# Patient Record
Sex: Male | Born: 1970 | Race: White | Hispanic: No | Marital: Married
Health system: Midwestern US, Community
[De-identification: ages and names within clinical notes are randomized; demographics above are authoritative.]

---

## 2015-07-02 ENCOUNTER — Emergency Department: Admit: 2015-07-03 | Payer: MEDICAID | Primary: Family

## 2015-07-02 DIAGNOSIS — R569 Unspecified convulsions: Principal | ICD-10-CM

## 2015-07-02 NOTE — ED Notes (Signed)
Pt reports "not remembering" seizure-like activity, but states, "I was outside et all of a sudden I got real hot. Then next thing I know, I was sitting on the ground." reports today is "the first day I haven't had any alcohol."

## 2015-07-02 NOTE — ED Notes (Signed)
Report given to nurse caring for bed 214

## 2015-07-02 NOTE — ED Notes (Signed)
Presents to ED bed 12 via EMS. EMS reports family present et reported "seizure like activity".

## 2015-07-02 NOTE — ED Notes (Signed)
UA obtained et sent to lab.

## 2015-07-02 NOTE — ED Notes (Signed)
Pt back to floor via CT staff

## 2015-07-02 NOTE — H&P (Signed)
History and Physical        Patient: Kristopher Day               Sex: male             MRN: [id number]     Date of Birth:  1971/05/24      Age:  44 y.o. Marijo File, NP               CC:    Chief Complaint   Patient presents with   ??? Seizure       HPI:     Kristopher Day is a 44 y.o. male who has HTN. He drinks beer 8-12 cans a day and vodka diluted with water almost everyday. His last ETOH intake was yesterday. Today, he reported drinking about 5-6 liters of water. This evening, he was talking to his fiancee when he was noted to have generalized tonic clonic seizures that lasted for 5 minutes with urinary incontinence and later had post ictal period lasting for several minutes. He denied prior headache or aura and denies seizures in the past. In the ER, he is awake and able to answer questions. His Na is 123. He was admitted    History reviewed.  pertinent past medical history. + HTN    History reviewed. No pertinent past surgical history.    History reviewed. No pertinent family history.    History     Social History   ??? Marital Status: MARRIED     Spouse Name: N/A   ??? Number of Children: N/A   ??? Years of Education: N/A     Social History Main Topics   ??? Smoking status: Current Every Day Smoker -- 1.00 packs/day for 24 years   ??? Smokeless tobacco: Not on file   ??? Alcohol Use: Yes      Comment: i used to drink beer, but i started on vodka   ??? Drug Use: Not on file   ??? Sexual Activity: Not on file     Other Topics Concern   ??? None     Social History Narrative   ??? None       None       No Known Allergies    Review of Systems  Constitutional: negative for fevers, chills and sweats  Eyes: negative for irritation and redness  Ears, Nose, Mouth, Throat, and Face: negative for hearing loss and tinnitus  Respiratory: negative for cough or wheezing  Cardiovascular: negative for chest pain, palpitations  Gastrointestinal: negative for nausea and vomiting  Genitourinary:negative for frequency, dysuria and nocturia   Integument/Breast: negative for rash and pruritus  Hematologic/Lymphatic: negative for easy bruising and bleeding  Musculoskeletal:negative for myalgias and arthralgias  Neurological: negative for headaches and dizziness  Behavioral/Psychiatric: negative for anxiety  Endocrine: negative for temperature intolerance  Allergic/Immunologic: negative for urticaria and hay fever      There is no immunization history on file for this patient.    Physical Exam:     BP 143/97 mmHg   Pulse 93   Temp(Src) 98.3 ??F (36.8 ??C)   Resp 22   Ht  (1.854 m)   Wt 180 lb   BMI 23.75 kg/m2   SpO2 96%  General appearance: Alert, cooperative, no distress.  Head: Normocephalic, without obvious abnormality, atraumatic.  Eyes: Conjunctivae/corneas clear. PERRL, EOM's intact.  Ears, nose, throat: Lips, mucosa, and tongue normal. Teeth and gums normal and normal findings: oropharynx pink & moist without lesions.  Respiratory: Clear to auscultation bilaterally.  Cardiovascular: Regular rate and rhythm, S1, S2 normal, no JVD, no murmur or pedal edema.  Gastrointestinal: Soft, non-tender, non-distended, bowel sounds present, no organomegaly.  Genitourinary: No scrotal mass or hernia.   Musculoskeletal: No edema, redness or tenderness in the calves or thighs.  Vascular: 2+ and symmetric.  No carotid bruits.   Skin: Skin color, texture, turgor normal. No rashes or lesions.  Neurologic: Alert and oriented X 3, normal strength and tone. Normal symmetric reflexes. Normal coordination and gait.   Hematologic/Lymphatic: No ecchymosis or petechiae. No lymphadenopathy.    Psychiatric: No confusion or hallucinations.            Lab/Data Reviewed:    CMP:   Lab Results   Component Value Date/Time    NA 123* 07/02/2015 10:00 PM    K 4.4 07/02/2015 10:00 PM    CL 87* 07/02/2015 10:00 PM    CO2 19* 07/02/2015 10:00 PM    AGAP 17* 07/02/2015 10:00 PM    GLU 133* 07/02/2015 10:00 PM    BUN 9 07/02/2015 10:00 PM    CREA 0.91 07/02/2015 10:00 PM     GFRAA >60 07/02/2015 10:00 PM    GFRNA >60 07/02/2015 10:00 PM    CA 9.1 07/02/2015 10:00 PM     CBC:   Lab Results   Component Value Date/Time    WBC 7.2 07/02/2015 10:00 PM    HGB 12.7* 07/02/2015 10:00 PM    HCT 35.8* 07/02/2015 10:00 PM    PLT 135 07/02/2015 10:00 PM     All Cardiac Markers in the last 24 hours: No results found for: CPK, CKMMB, CKMB, RCK3, CKMBT, CKNDX, CKND1, MYO, TROPT, TROIQ, TROI, TROPT, TNIPOC, BNP, BNPP  Recent Glucose Results:   Lab Results   Component Value Date/Time    GLU 133* 07/02/2015 10:00 PM     ABG: No results found for: PH, PHI, PCO2, PCO2I, PO2, PO2I, HCO3, HCO3I, FIO2, FIO2I    CT head: no acute abnormality    Assessment/Plan     Patient Active Hospital Problem List:   New onset seizure (HCC) (07/02/2015)    Assessment: this may be related to ETOH use, ? Related to hyponatremia, vs. Primary seizure disorder    Plan: admit to telemetry  EEG, MRI of head  Ativan iv prn, will not start anticonvulsants at this time  Consult neurology     Hyponatremia (07/02/2015)    Assessment: may be due to polydipsia or photomania    Plan: restrict oral fluid intake to 1 liter a day  Consult nephrology. I discussed case with Dr. Penni BombardKendall  Serial BMP  Stop ETOH use  Saline infusion at 75 cc/hour     Alcohol abuse (07/02/2015)    Assessment: active    Plan: on thiamine, folic acid  Serax  Ativan iv prn for agitation  Consult psychiatry     HTN (hypertension) (07/02/2015)    Assessment: active    Plan: on lisinopril     Tobacco abuse (07/02/2015)    Assessment: chronic    Plan: smoking cessation, nicotine patch    DVT prophylaxis: SCD    Code status: full code    Case discussed with ER physician and nephrology    High risk due to seizure, hyponatremia, ETOH abuse           Melchizedek Espinola A Quindarrius [place], MD  11:38 PM

## 2015-07-03 ENCOUNTER — Inpatient Hospital Stay
Admit: 2015-07-03 | Discharge: 2015-07-05 | Disposition: A | Discharge status: Home / Self Care | Payer: MEDICAID | Attending: Family Medicine | Admitting: Family Medicine

## 2015-07-03 ENCOUNTER — Inpatient Hospital Stay: Admit: 2015-07-03 | Payer: MEDICAID | Primary: Family

## 2015-07-03 LAB — METABOLIC PANEL, COMPREHENSIVE
A-G Ratio: 1.3 (ref 1.2–2.2)
ALT (SGPT): 153 U/L — ABNORMAL HIGH (ref 12–78)
AST (SGOT): 123 U/L — ABNORMAL HIGH (ref 15–37)
Albumin: 4 g/dL (ref 3.4–5.0)
Alk. phosphatase: 55 U/L (ref 45–117)
Anion gap: 10 mmol/L (ref 6–15)
BUN/Creatinine ratio: 10 (ref 7–25)
BUN: 8 MG/DL (ref 7–18)
Bilirubin, total: 0.8 MG/DL (ref <1.1)
CO2: 26 mmol/L (ref 21–32)
Calcium: 9.2 MG/DL (ref 8.5–10.1)
Chloride: 98 mmol/L (ref 98–107)
Creatinine: 0.78 MG/DL (ref 0.60–1.30)
GFR est AA: >60 mL/min/{1.73_m2} (ref >60)
GFR est non-AA: >60 mL/min/{1.73_m2} (ref >60)
Globulin: 3.2 g/dL (ref 2.4–3.5)
Glucose: 86 mg/dL (ref 70–110)
Potassium: 4.2 mmol/L (ref 3.5–5.3)
Protein, total: 7.2 g/dL (ref 6.4–8.2)
Sodium: 134 mmol/L — ABNORMAL LOW (ref 136–145)

## 2015-07-03 LAB — URINALYSIS W/ RFLX MICROSCOPIC
Bilirubin: NEGATIVE
Glucose: NEGATIVE mg/dL
Ketone: 40 mg/dL — AB
Leukocyte Esterase: NEGATIVE
Nitrites: NEGATIVE
Specific gravity: 1.01 (ref 1.002–1.030)
Urobilinogen: 0.2 EU/dL (ref 0–1)
pH (UA): 5 (ref 4.5–8.0)

## 2015-07-03 LAB — ACETAMINOPHEN: Acetaminophen level: <2 ug/mL — ABNORMAL LOW (ref 10–30)

## 2015-07-03 LAB — CBC WITH AUTOMATED DIFF
ABS. BASOPHILS: 0 10*3/uL (ref 0.0–0.1)
ABS. BASOPHILS: 0 10*3/uL (ref 0.0–0.1)
ABS. EOSINOPHILS: 0.1 10*3/uL (ref 0.0–0.5)
ABS. EOSINOPHILS: 0.1 10*3/uL (ref 0.0–0.5)
ABS. LYMPHOCYTES: 1.4 10*3/uL (ref 0.8–3.5)
ABS. LYMPHOCYTES: 2.3 10*3/uL (ref 0.8–3.5)
ABS. MONOCYTES: 0.5 10*3/uL — ABNORMAL LOW (ref 0.8–3.5)
ABS. MONOCYTES: 0.6 10*3/uL — ABNORMAL LOW (ref 0.8–3.5)
ABS. NEUTROPHILS: 5.2 10*3/uL (ref 1.5–8.0)
ABS. NEUTROPHILS: 4.2 10*3/uL (ref 1.5–8.0)
BASOPHILS: 0 % (ref 0–2)
BASOPHILS: 0 % (ref 0–2)
EOSINOPHILS: 2 % (ref 0–5)
EOSINOPHILS: 2 % (ref 0–5)
HCT: 35.8 % — ABNORMAL LOW (ref 41–53)
HCT: 38.7 % — ABNORMAL LOW (ref 41–53)
HGB: 12.7 g/dL — ABNORMAL LOW (ref 13.5–17.5)
HGB: 13.6 g/dL (ref 13.5–17.5)
LYMPHOCYTES: 19 % (ref 19–48)
LYMPHOCYTES: 32 % (ref 19–48)
MCH: 34.3 PG — ABNORMAL HIGH (ref 27–31)
MCH: 33.9 PG — ABNORMAL HIGH (ref 27–31)
MCHC: 35.5 g/dL (ref 31–37)
MCHC: 35.1 g/dL (ref 31–37)
MCV: 96.8 FL (ref 78–98)
MCV: 96.5 FL (ref 78–98)
MONOCYTES: 6 % (ref 3–9)
MONOCYTES: 9 % (ref 3–9)
MPV: 9 FL (ref 5.9–10.3)
MPV: 9.7 FL (ref 5.9–10.3)
NEUTROPHILS: 73 % (ref 40–74)
NEUTROPHILS: 57 % (ref 40–74)
PLATELET: 135 10*3/uL (ref 130–400)
PLATELET: 151 10*3/uL (ref 130–400)
RBC: 3.7 M/uL — ABNORMAL LOW (ref 4.7–6.1)
RBC: 4.01 M/uL — ABNORMAL LOW (ref 4.7–6.1)
RDW: 12.1 % (ref 11.5–14.5)
RDW: 12.2 % (ref 11.5–14.5)
WBC: 7.2 10*3/uL (ref 4.5–10.8)
WBC: 7.2 10*3/uL (ref 4.5–10.8)

## 2015-07-03 LAB — DRUG SCREEN, URINE
AMPHETAMINES: NEGATIVE
BARBITURATES: NEGATIVE
BENZODIAZEPINES: NEGATIVE
COCAINE: NEGATIVE
METHADONE: NEGATIVE
OPIATES: NEGATIVE
PCP(PHENCYCLIDINE): NEGATIVE
THC (TH-CANNABINOL): NEGATIVE
TRICYCLICS: NEGATIVE

## 2015-07-03 LAB — METABOLIC PANEL, BASIC
Anion gap: 17 mmol/L — ABNORMAL HIGH (ref 6–15)
Anion gap: 6 mmol/L (ref 6–15)
Anion gap: 6 mmol/L (ref 6–15)
Anion gap: 9 mmol/L (ref 6–15)
BUN/Creatinine ratio: 10 (ref 7–25)
BUN/Creatinine ratio: 8 (ref 7–25)
BUN/Creatinine ratio: 8 (ref 7–25)
BUN/Creatinine ratio: 11 (ref 7–25)
BUN: 9 MG/DL (ref 7–18)
BUN: 7 MG/DL (ref 7–18)
BUN: 8 MG/DL (ref 7–18)
BUN: 9 MG/DL (ref 7–18)
CO2: 19 mmol/L — ABNORMAL LOW (ref 21–32)
CO2: 28 mmol/L (ref 21–32)
CO2: 30 mmol/L (ref 21–32)
CO2: 24 mmol/L (ref 21–32)
Calcium: 9.1 MG/DL (ref 8.5–10.1)
Calcium: 9.1 MG/DL (ref 8.5–10.1)
Calcium: 9.6 MG/DL (ref 8.5–10.1)
Calcium: 9.3 MG/DL (ref 8.5–10.1)
Chloride: 87 mmol/L — ABNORMAL LOW (ref 98–107)
Chloride: 99 mmol/L (ref 98–107)
Chloride: 96 mmol/L — ABNORMAL LOW (ref 98–107)
Chloride: 99 mmol/L (ref 98–107)
Creatinine: 0.91 MG/DL (ref 0.60–1.30)
Creatinine: 0.89 MG/DL (ref 0.60–1.30)
Creatinine: 1.02 MG/DL (ref 0.60–1.30)
Creatinine: 0.82 MG/DL (ref 0.60–1.30)
GFR est AA: >60 mL/min/{1.73_m2} (ref >60)
GFR est AA: >60 mL/min/{1.73_m2} (ref >60)
GFR est AA: >60 mL/min/{1.73_m2} (ref >60)
GFR est AA: >60 mL/min/{1.73_m2} (ref >60)
GFR est non-AA: >60 mL/min/{1.73_m2} (ref >60)
GFR est non-AA: >60 mL/min/{1.73_m2} (ref >60)
GFR est non-AA: >60 mL/min/{1.73_m2} (ref >60)
GFR est non-AA: >60 mL/min/{1.73_m2} (ref >60)
Glucose: 133 mg/dL — ABNORMAL HIGH (ref 70–110)
Glucose: 108 mg/dL (ref 70–110)
Glucose: 96 mg/dL (ref 70–110)
Glucose: 127 mg/dL — ABNORMAL HIGH (ref 70–110)
Potassium: 4.4 mmol/L (ref 3.5–5.3)
Potassium: 4 mmol/L (ref 3.5–5.3)
Potassium: 4 mmol/L (ref 3.5–5.3)
Potassium: 4 mmol/L (ref 3.5–5.3)
Sodium: 123 mmol/L — ABNORMAL LOW (ref 136–145)
Sodium: 133 mmol/L — ABNORMAL LOW (ref 136–145)
Sodium: 132 mmol/L — ABNORMAL LOW (ref 136–145)
Sodium: 132 mmol/L — ABNORMAL LOW (ref 136–145)

## 2015-07-03 LAB — CBC W/O DIFF
HCT: 38 % — ABNORMAL LOW (ref 41–53)
HGB: 13.3 g/dL — ABNORMAL LOW (ref 13.5–17.5)
MCH: 33.6 PG — ABNORMAL HIGH (ref 27–31)
MCHC: 35 g/dL (ref 31–37)
MCV: 96 FL (ref 78–98)
MPV: 9.3 FL (ref 5.9–10.3)
PLATELET: 139 10*3/uL (ref 130–400)
RBC: 3.96 M/uL — ABNORMAL LOW (ref 4.7–6.1)
RDW: 12.1 % (ref 11.5–14.5)
WBC: 4.8 10*3/uL (ref 4.5–10.8)

## 2015-07-03 LAB — CORTISOL: Cortisol, random: 12.9 ug/dL

## 2015-07-03 LAB — SODIUM, UR, RANDOM: Sodium,urine random: 22 MMOL/L (ref 20–110)

## 2015-07-03 LAB — URINE MICROSCOPIC

## 2015-07-03 LAB — TSH 3RD GENERATION: TSH: 2.56 u[IU]/mL (ref 0.35–3.74)

## 2015-07-03 LAB — SALICYLATE: Salicylate level: <2.8 MG/DL — ABNORMAL LOW (ref 2.8–20.0)

## 2015-07-03 LAB — ETHYL ALCOHOL: ALCOHOL(ETHYL),SERUM: NOT DETECTED MG/DL

## 2015-07-03 LAB — MAGNESIUM: Magnesium: 2.1 mg/dL (ref 1.8–2.4)

## 2015-07-03 LAB — PHOSPHORUS: Phosphorus: 3.7 MG/DL (ref 2.5–4.9)

## 2015-07-03 LAB — T4 (THYROXINE): T4, Total: 7.2 ug/dL (ref 4.7–13.3)

## 2015-07-03 MED ORDER — PANTOPRAZOLE 40 MG TAB, DELAYED RELEASE
40 mg | Freq: Every day | ORAL | Status: DC
Start: 2015-07-03 — End: 2015-07-05
  Administered 2015-07-03 – 2015-07-05 (×3): via ORAL

## 2015-07-03 MED ORDER — DEXTROSE 5% IN WATER (D5W) IV
Freq: Once | INTRAVENOUS | Status: AC
Start: 2015-07-03 — End: 2015-07-03
  Administered 2015-07-03: 11:00:00 via INTRAVENOUS

## 2015-07-03 MED ORDER — SODIUM CHLORIDE 0.9 % IV
INTRAVENOUS | Status: DC
Start: 2015-07-03 — End: 2015-07-03
  Administered 2015-07-03: 05:00:00 via INTRAVENOUS

## 2015-07-03 MED ORDER — LORAZEPAM 2 MG/ML IJ SOLN
2 mg/mL | INTRAMUSCULAR | Status: DC | PRN
Start: 2015-07-03 — End: 2015-07-05
  Administered 2015-07-03 – 2015-07-05 (×3): via INTRAVENOUS

## 2015-07-03 MED ORDER — ACETAMINOPHEN 325 MG TABLET
325 mg | Freq: Four times a day (QID) | ORAL | Status: DC | PRN
Start: 2015-07-03 — End: 2015-07-05

## 2015-07-03 MED ORDER — DEXTROSE 5% IN WATER (D5W) IV
INTRAVENOUS | Status: DC
Start: 2015-07-03 — End: 2015-07-04
  Administered 2015-07-03 – 2015-07-04 (×4): via INTRAVENOUS

## 2015-07-03 MED ORDER — OXAZEPAM 10 MG CAP
10 mg | Freq: Three times a day (TID) | ORAL | Status: DC
Start: 2015-07-03 — End: 2015-07-05
  Administered 2015-07-03 – 2015-07-05 (×8): via ORAL

## 2015-07-03 MED ORDER — FOLIC ACID 1 MG TAB
1 mg | Freq: Every day | ORAL | Status: DC
Start: 2015-07-03 — End: 2015-07-05
  Administered 2015-07-03 – 2015-07-05 (×4): via ORAL

## 2015-07-03 MED ORDER — THIAMINE HCL 100 MG TAB
100 mg | Freq: Every day | ORAL | Status: DC
Start: 2015-07-03 — End: 2015-07-05
  Administered 2015-07-03 – 2015-07-05 (×4): via ORAL

## 2015-07-03 MED ORDER — LEVETIRACETAM 250 MG TAB
250 mg | Freq: Two times a day (BID) | ORAL | Status: DC
Start: 2015-07-03 — End: 2015-07-05
  Administered 2015-07-03 – 2015-07-05 (×5): via ORAL

## 2015-07-03 MED ORDER — SODIUM CHLORIDE 0.9 % IJ SYRG
Freq: Three times a day (TID) | INTRAMUSCULAR | Status: DC
Start: 2015-07-03 — End: 2015-07-05
  Administered 2015-07-03 – 2015-07-05 (×8): via INTRAVENOUS

## 2015-07-03 MED ORDER — THIAMINE 100 MG/ML INJECTION
100 mg/mL | Freq: Once | INTRAMUSCULAR | Status: AC
Start: 2015-07-03 — End: 2015-07-02
  Administered 2015-07-03: 02:00:00 via INTRAVENOUS

## 2015-07-03 MED ORDER — NICOTINE 21 MG/24 HR DAILY PATCH
21 mg/24 hr | TRANSDERMAL | Status: DC
Start: 2015-07-03 — End: 2015-07-05
  Administered 2015-07-05: 05:00:00 via TRANSDERMAL

## 2015-07-03 MED ORDER — LISINOPRIL 10 MG TAB
10 mg | Freq: Two times a day (BID) | ORAL | Status: DC
Start: 2015-07-03 — End: 2015-07-05
  Administered 2015-07-03 – 2015-07-05 (×7): via ORAL

## 2015-07-03 MED ORDER — SODIUM CHLORIDE 0.9 % IJ SYRG
INTRAMUSCULAR | Status: DC | PRN
Start: 2015-07-03 — End: 2015-07-05

## 2015-07-03 MED FILL — LEVETIRACETAM 250 MG TAB: 250 mg | ORAL | Qty: 1

## 2015-07-03 MED FILL — OXAZEPAM 10 MG CAP: 10 mg | ORAL | Qty: 1

## 2015-07-03 MED FILL — LISINOPRIL 10 MG TAB: 10 mg | ORAL | Qty: 2

## 2015-07-03 MED FILL — DEXTROSE 5% IN WATER (D5W) IV: INTRAVENOUS | Qty: 1000

## 2015-07-03 MED FILL — NICOTINE 21 MG/24 HR DAILY PATCH: 21 mg/24 hr | TRANSDERMAL | Qty: 1

## 2015-07-03 MED FILL — PANTOPRAZOLE 40 MG TAB, DELAYED RELEASE: 40 mg | ORAL | Qty: 1

## 2015-07-03 MED FILL — NORMAL SALINE FLUSH 0.9 % INJECTION SYRINGE: INTRAMUSCULAR | Qty: 10

## 2015-07-03 MED FILL — SODIUM CHLORIDE 0.9 % IV: INTRAVENOUS | Qty: 1000

## 2015-07-03 MED FILL — FOLIC ACID 1 MG TAB: 1 mg | ORAL | Qty: 1

## 2015-07-03 MED FILL — VITAMIN B-1 100 MG TABLET: 100 mg | ORAL | Qty: 1

## 2015-07-03 MED FILL — LORAZEPAM 2 MG/ML IJ SOLN: 2 mg/mL | INTRAMUSCULAR | Qty: 1

## 2015-07-03 NOTE — Progress Notes (Signed)
Problem: Seizure Disorder (Adult)  Goal: Interventions  Outcome: Progressing Towards Goal  No seizure activity since admission    Problem: Diarrhea (Adult and Pediatrics)  Goal: *Absence of diarrhea  Outcome: Progressing Towards Goal  Pt reports having diarrhea prior to admission

## 2015-07-03 NOTE — Progress Notes (Signed)
Chart screened for discharge planning needs per Case Management high risk protocol.  No intervention is required at this time.   Though no CM staff have been assigned to this case, CSW/CM are available to assist PRN should needs arise.

## 2015-07-03 NOTE — Progress Notes (Signed)
Bedside and Verbal shift change report given to Angie,rN (oncoming nurse) by Jennifer,rN (offgoing nurse). Report included the following information SBAR, Kardex, Intake/Output, MAR and Recent Results.

## 2015-07-03 NOTE — Consults (Signed)
[place] [facility]  CONSULTATION REPORT    Name:  Kristopher Day, Kristopher Day  MR #:  [id number]    Account #:  [id number]    DOB:  1971/08/11    Age:  44Y  Location:  214  Admitted:  07/02/2015    DATE OF CONSULTATION:    07/03/2015  REFERRING PHYSICIAN:          Hermenia Bers, MICHAEL R       REASON FOR CONSULTATION:  Nephrology consultation is being obtained regarding evaluation and management of hyponatremia in this 44 year old Caucasian male who was admitted last night with chief complaint of new onset seizure. The patient admits to a long history of heavy alcohol ingestion and began reducing alcohol consumption over the past several weeks after being told that liver function tests were abnormal.  He did not ingest any alcohol on the day of admission and instead drank a large quantity of water.  He was brought to the Emergency Room after having a seizure and admitted for evaluation and therapy.  Laboratory testing at the time of presentation indicated the sodium was 123 mEq/L, chloride was depressed to 87 mEq/L, carbon dioxide content was 19 mEq/L, anion gap was increased to 17 mmol/L, glucose was 133 mg/dL, calcium was normal, potassium was normal, and BUN and creatinine were normal. Sodium was depressed to 134 mEq/L on 05/11/2015. The patient denied prior knowledge of hyponatremia.     MEDICATIONS PRIOR TO ADMISSION:  1. Oral and topical Diclofenac.   2. Lisinopril.     He was not taking diuretics or other pharmaceutical agents at the time of admission.     Thyroid function testing was normal today. Urinalysis indicated the presence of trace protein, small blood and 40 mg/dL ketone by dipstick. Microscopic examination of the urinary sediment was unremarkable. Urine sodium was 22 mmol/L.      PAST MEDICAL HISTORY:  1. Hypertension.  2. Alcoholism.   3. Elevated liver function testing. The appearance of the liver was normal on right upper quadrant ultrasound performed on 06/06/2015.      PAST SURGICAL HISTORY:   He has undergone no surgery.    FAMILY HISTORY:   Negative for hepatic problems.     SOCIAL HISTORY:   The patient was not employed at the time of admission.  He smoked  cigarettes and used alcohol as noted above.      REVIEW OF SYSTEMS:   GENERAL:  Significant for weakness.    NEUROLOGIC: Significant for headache and new onset seizures.    OPHTHALMOLOGIC: Negative for changes in visual acuity.   ENDOCRINOLOGIC: Negative for knowledge of symptoms of diabetes mellitus or thyroid disease.    PULMONARY: Negative for shortness of breath, productive cough or hemoptysis. Chest radiograph at the time of admission was normal.    CARDIAC: Negative for chest pain, palpitations or knowledge of heart disease.  GI: Negative for nausea, vomiting, abdominal pain or bowel disturbance.    GU:  Negative for knowledge or symptoms of nephrolithiasis or complaints referable to the lower urinary tract.  MUSCULOSKELETAL: Positive for pain in the left knee and great toes in the setting of gouty arthritis. Patient was using oral and topical non-steroidal anti-inflammatory drug for symptomatic relief.  DERMATOLOGIC: Negative for skin rash or skin breakdown.  HEMATOLOGIC: Hemogram was normal.  Negative for visible blood loss.  IMMUNOLOGIC:  Negative for symptoms of connective tissue or vasculitis.    PSYCHIATRIC: Positive for alcohol abuse.  Negative for  symptoms of delirium or effective disorder.      PHYSICAL EXAMINATION:   The patient was a well-developed, well-nourished, middle-aged male in no acute distress.  Earlier today the temperature was 98.2, heart rate 80, respiratory rate 16, blood pressure 112/73, oxygen saturation 100%.   HEENT:   Sclerae clear.  Mucous membranes moist. Oropharynx clear.   NECK:  No lymphadenopathy, JVD, thyromegaly, or bruits.    LUNGS:  Clear to auscultation.  Respirations unlabored.    HEART:  Rhythm regular without pericardial friction rub or significant murmur.     ABDOMEN:  Soft without masses, organomegaly, tenderness, or bruits.   EXTREMITIES:  No edema.  Digits well perfused.   SKIN:  No rashes present.    IMPRESSION:   Middle-aged male with hyponatremia possibly due to inadequate solute ingestion in the setting of longstanding heavy alcohol use and excessive oral fluid intake. Duration of hyponatremia is uncertain but sodium may have precipitously decreased following free water ingestion immediately prior to onset of seizure. Hyponatremia has improved following  administration of isotonic saline. Sodium has increased to 132 mEq/L.  Hydration is currently being administered using 5% Dextrose in water to prevent inappropriately rapid correction of hyponatremia. The patient is asymptomatic from the standpoint of hyponatremia. Administration of hypotonic intravenous fluid will be continued at 75 cc/hour. Chemistries will be carefully followed. Electrolyte imbalances will be treated as indicated.  It is likely that if the patient abstains from alcohol and follows a normal diet, hyponatremia will resolve and not reoccur.  He was advised of the  importance of abstaining from alcohol. Keppra has been started to prevent further seizures.       __________________________________   Carmin RichmondJames Antionio Negron, M.D.    JE:sp  DD: 07/03/2015 21:01:28  DT: 07/04/2015 07:01:36  Job ID:  [id number]  CC:

## 2015-07-03 NOTE — Progress Notes (Signed)
Lab called delta failure for sodium 134 today.  Primary RN notified.

## 2015-07-03 NOTE — Consults (Signed)
[id number]

## 2015-07-03 NOTE — Consults (Signed)
[place] [facility]  CONS[facility]ATION [facility]    Name:  Kristopher DissHARRIS, Blayton  MR #:  [id number]    Account #:  [id number]    DOB:  Oct 31, 1971    Age:  44Y  Location:  214  Admitted:  07/02/2015    DATE OF CONSULTATION:    07/03/2015  REFERRING PHYSICIAN:          SMUTKO, MICHAEL R       SOURCE OF INFORMATION:  The patient's fiance, Miss Shanda BumpsJessica and review of the medical record.  The patient agreed to be evaluated in front of fiance.     REASON FOR PSYCHIATRIC CONSULTATION:   Psychiatric evaluation of a patient who was admitted following a  seizure.      DEMOGRAPHICS:  This is a 44 year old Caucasian male, single, engage, currently  unemployed, recently got laid off due to corporate cut back.  He  currently lives with his fiance with two children who are currently  in church.   He denies any prior history of psychiatric treatment.     CHIEF COMPLAINT:   EMG took the patient to the hospital due to seizure.     HISTORY OF PRESENT ILLNESS:   The patient reports that he doesn't know exactly know what happened  prior to his coming to the hospital.  He tells me that he was told  he had a seizure.  The patient's fiance was there and the patient  was evaluated in front of with patient's consent.  She had stated  that the patient was outside and they had a brief chat with patient  and then the next thing she noted was he fell over, she was very  terrified and she called the EMS.  She noted that he had started  having tonic clonic convulsions; the patient was jerking on the  floor and not breathing. By the time he came around he was confused  and she apparently reported that he had turned blue.  When the EMS  came he got into the EMS and the EMS took the patient to the  hospital.  The patient was transferred from the Emergency Room to  the medical service.  The patient stated that he had been drinking  beer, about 15 beers per day; sometimes he takes vodka up to about a   5th of vodka per day.  He reported that he had been trying to get  off beer by taking vodka, he went down to about 8 to 9 beers and he  decided to stop cold [place]. Following stopping alcohol he reports  that he had the seizure episode.  He reports that he has been  drinking for about 20 years now.  He has never had a blackout from  drinking and he denies any prior history of seizure from drinking  until this current episode.  He also denies a prior history of  seizure.  He reports that this is the first time he has had a  seizure in his life.  He said that he has really never had a history  of withdrawal symptoms because he is always drinking alcohol.  The  patient reports he is currently having cravings, he has reduced  sleep, and he is having shakes.  He stated that drinking helps with  his sleep. The patient denies any auditory, visual or tactile  hallucinations.  He denied delusions.  He denies any paranoid of  persecutory ideation.  He denies depression.  He denies anhedonia.  He reports even though he has anxiety he rarely has panic attacks.  The patient reports that he has never really been sober.  He has had  several consequences from alcohol use, namely failing health and  finances.  Also he stated his father has an alcohol problem.  He  stated that nothing good has really come out of his alcohol use.  He  denies any mood swings, irritability or any manic symptoms.  The  patient denies any homicidal thoughts.  The patient denies any  concurrent use of any other substances.     PAST PSYCHIATRIC HISTORY:   The patient reports that he has never been through detox.  He has a  history of two DUIs in the past requiring him to go to court  mandated rehab; first time was for 72 hours and another time he  reports he was sober for several days, cannot remember how many days  he stayed in rehab.  He stated that when he started alcohol it only  took a couple to get drunk, however, it got to the point where he   started having to drink 15 to get drunk.  He denies any history of  suicide attempt. He denies any history of violence. He doesn't have  a psychiatrist.  He has never been through AA or NA.      PAST MEDICAL HISTORY:   The patient reports a prior history of hypertension, sinusitis,  however, denies any history of DTs of blackouts. He has never had a  seizure until current episode related to his current admission.     PAST SURGICAL HISTORY:   The patient denies.     ALLERGIES:   SEASONAL ALLERGIES.    FAMILY HISTORY:   Noncontributory.    PERSONAL AND SOCIAL HISTORY:   The patient was born in [place], [place], raised by his parents,  reports upbringing as good.  He reports that he is educated and had  actually gone into the [employer], having about four years the  [employer].  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  He received an honorable discharge not service connected and he is  working on getting service connected.      LEGAL HISTORY:   The patient reports a history of two DUIs.      REVIEW OF SYSTEMS:   Performed by the medical service.    PHYSICAL EXAMINATION:   Unchanged from that performed by the medical service.     MENTAL STATUS EXAMINATION:   This is a young man looking his stated age, dressed appropriate to  weather with fair grooming and hygiene.  He is calm, cooperative and  appropriate in behavior.  Speech is spontaneous, clear and coherent.  Mood is stable. Thoughts are logical and goal directed.  He denied  suicidal thoughts.  He denies homicidal thoughts.   He denies  hallucination.  He denied delusions.  He denied any paranoid or  persecutory ideation.  He is alert, awake, and oriented to time,  place, and person.   He has good attention span.  He has fairly good  insight and judgement.      DIAGNOSTIC LABS:  WBCs 4.8, platelets 139, sodium 133, potassium 4.0.  BUN 7,  creatinine 0.89.   MRI brain shows normal noncontrasted MRI.  The   patient had an EEG performed in view of his seizures and alcohol  use. There were no epileptiform discharges or electrographic  seizures noted,  current report by Dr. Harlene Salts.       DSM DIAGNOSIS:  AXIS I:   Alcohol use disorder-severe.                 History of unspecified anxiety disorder.   AXIS II:  Deferred.  AXIS III: History of hypertension, seizure, and seasonal allergies.    AXIS IV:  Poor coping skills, chemical dependency, recent job loss.  AXIS V:   Global Assessment of Functioning of 30-35.     ASSESSMENT:   This is a 44 year old Caucasian male who has a long history of  alcohol use disorder who was admitted because he had had a seizure  after trying to come off of alcohol cold [place]. Psychiatry was  called to evaluate the patient.  When I spoke with the patient the  patient denies any concurrent use of other substances. He reported  that the seizure was following his trying to come off of alcohol  cold [place] after having been drinking every day for about 20 years.  He has been in the [employer] in the past and currently he has just  recently lost his job. His fiance who he is engaged to is very  supportive of patient.  He reports his fiance does not drink.  The  patient denies suicidal thoughts, denies homicidal thoughts, denies  depression, and denies anhedonia. He verbalized feeling hopeful and  reports intact self-esteem.  The patient denies any psychosis.  He  denies any prior history of DTs.  The patient would benefit from  inpatient behavioral health detox; however, when this was discussed  with the patient.  The patient appears to be undecided on whether or  not he wants to come for inpatient behavioral health detox or not.  He stated that he would like to consult with his fiance and decide  whether or not he intends to come. The patient was, however,  informed that it is safer for him medically to come to inpatient  detox and he was educated on the dangers of withdrawal from alcohol   which include repeated seizure and accidental death and he  verbalized adequate understanding.  Other consequences of alcohol  were also discussed with patient. The patient stated that he was  going to think about it.     PSYCHIATRIC RECOMMENDATIONS:   The patient needs inpatient behavioral health detox (this has to be  voluntary).  If the patient is unwilling to come for inpatient  behavioral health detox then psychiatric intake to call for further  follow-up of substance abuse appointment with patient and to give  patient contacts of preventative community substance abuse treatment  options including residential rehab and outpatient substance abuse  treatment options. Psychiatry will continue to follow the patient.  Continue current detox protocol. Call psychiatric MD on call as  needed.        _____________________________   Derry Lory, M.D.    AO:sp  DD: 07/03/2015 21:08:00  DT: 07/04/2015 07:22:05  Job ID:  [id number]  CC:

## 2015-07-03 NOTE — Progress Notes (Signed)
Pt settled into assigned bed, AAOx4, denies pain or difficulty breathing on room air, resp are even and unlabored, call light in reach, plan of care reviewed with pt, telemetry in use monitored via remote monitoring, will cont to monitor.

## 2015-07-03 NOTE — Consults (Signed)
[id number]

## 2015-07-03 NOTE — Progress Notes (Signed)
Primary Nurse Marlowe AltNATALIE R YATES, RN and Tresa EndoKelly, RN performed a dual skin assessment on this patient No impairment noted  Braden score is 23

## 2015-07-03 NOTE — Procedures (Signed)
[place] [facility]  ELECTROENCEPHALOGRAM    Name:  Kristopher Day, Kristopher Day  MR #:  [id number]  Account #:  [id number]  DOB:  07/23/1971  Age:  44Y  Ref MD:  SMUTKO, MICHAEL R      Location: 214  Date of Service:  07/02/2015    ELECTROENCEPHALOGRAM REPORT    CLINICAL HISTORY:   This is a 44 year old male who had a seizure and was found to have  hyponatremia and alcohol abuse.      EEG DESCRIPTION:  When maximally awake, posterior dominant rhythm consisted of 9-10  hertz Alpha rhythm. Photic stimulation produced a driving response.  Drowsiness is characterized by diffuse Theta and Delta slowing.        IMPRESSION:  This is a normal awake and drowsy electroencephalogram.    CONCLUSION:  There were no epileptiform discharges or electrographic seizures  noted.       ___________________________________  Kaidan Spengler, M.D., Clinical Neurophysiology    SK:sp  DD:07/03/2015 11:41:54   DT: 07/03/2015 11:50:25   Job ID:  [id number]  CC:

## 2015-07-03 NOTE — Progress Notes (Signed)
Problem: Falls - Risk of  Goal: *Absence of falls  Outcome: Progressing Towards Goal  No falls noted throughout the shift  Goal: *Knowledge of fall prevention  Outcome: Progressing Towards Goal  Fall risk assessment completed upon admission    Problem: Alcohol Withdrawal  Goal: Interventions  Outcome: Progressing Towards Goal  See medications available

## 2015-07-03 NOTE — Consults (Signed)
Chauncy Lean, MD   [address], [address] [place], [place] [address]  Phone:  [phone]  Fax:  [phone]      Patient ID  Name:  Kristopher Day  DOB:  1971-08-08  MRN:  [id number]  Age:  44 y.o.  PCP:  Marijo File, NP    Subjective:     Encounter Date:  07/02/2015    Referring Physician: No ref. provider found    Chief Complaint   Patient presents with   ??? Seizure       History of Present Illness:   44 year old male with no seizure risk factors, has being drinking excess alcohol since few years. Was trying to cut down on it. Seizure yesterday for 3 minutes, generalized shaking. UI, followed by confusion. No previous episodes like this. On admission sodium is 123.   since admission no episodes  No Known Allergies  Patient Active Problem List   Diagnosis Code   ??? New onset seizure (HCC) R56.9   ??? Hyponatremia E87.1   ??? Alcohol abuse F10.10   ??? HTN (hypertension) I10   ??? Tobacco abuse Z72.0   ??? Transaminitis R74.0     History reviewed. No pertinent past medical history.   History reviewed. No pertinent past surgical history.   History reviewed. No pertinent family history.   History     Social History   ??? Marital Status: MARRIED     Spouse Name: N/A   ??? Number of Children: N/A   ??? Years of Education: N/A     Social History Main Topics   ??? Smoking status: Current Every Day Smoker -- 1.00 packs/day for 24 years   ??? Smokeless tobacco: Not on file   ??? Alcohol Use: Yes      Comment: i used to drink beer, but i started on vodka   ??? Drug Use: Not on file   ??? Sexual Activity: Not on file     Other Topics Concern   ??? None     Social History Narrative   ??? None       Review of Systems:  ROS      Objective:     Filed Vitals:    07/03/15 0729   BP: 115/76   Pulse: 70   Temp: 97.4 ??F (36.3 ??C)   Resp: 18   Height:    Weight:    SpO2: 100%       Physical Exam:  Neurologic Exam     Mental Status   Oriented to person, place, and time.     Cranial Nerves   Cranial nerves II through XII intact.     Motor Exam    Muscle bulk: normal  Overall muscle tone: normal    Strength   Strength 5/5 throughout.     Sensory Exam   Light touch normal.     Gait, Coordination, and Reflexes     Gait  Gait: normal    Reflexes   Right brachioradialis: 2+  Left brachioradialis: 2+  Right biceps: 2+  Left biceps: 2+  Right triceps: 2+  Left triceps: 2+  Right patellar: 2+  Left patellar: 2+  Right achilles: 2+  Left achilles: 2+  Left grip: 2+  Right plantar: normal  Left plantar: normal  Right Hoffman: absent  Left Hoffman: absent  Right ankle clonus: absent  Left ankle clonus: absent          Impression:     Encounter Diagnoses  ICD-10-CM ICD-9-CM   1. Convulsions, unspecified convulsion type (HCC) R56.9 780.39   2. Hyponatremia E87.721 68276.321   44 year old male with seizure in the setting of alcohol , hyponatremia    Plan:     1) will treat him seizure medication for 6 months as there is higher chance of relapse into alcohol.    2) keppra 250mg  po bid for 3 days, keppra 500 bid for 3 days, keppra 750mg  po bid    3) mood problems with keppra explained.    Follow-up Disposition: Not on File  Signed By:  Chauncy LeanSreekanth Luretta Everly, MD     07/03/2015    I have spent  55  minutes of ward time in reviewing CT images,reviewing labs., discussion with nursing staff/pt and pt's wife about plan of care,

## 2015-07-03 NOTE — Progress Notes (Signed)
Pt resting in bed, no s/s of distress noted. Heart monitor is on. No further concerns noted. Call light within reach. Hourly rounding continued, 24 hr chart check complete. Will continue to monitor.

## 2015-07-03 NOTE — Progress Notes (Signed)
Pt c/o restlessness and feeling anxious; [facility]; call light in reach, will cont to monitor. Resp even and unlabored at this time on room air.

## 2015-07-03 NOTE — Progress Notes (Signed)
Progress Note      Patient: Kristopher Day               Sex: male          MRN: [id number]           Date of Birth:  03/27/1971      Age:  44 y.o.                    Subjective:     Doing well today, no further seizure activity.  Pt wants to stop drinking. Drank a large amount of water to stay hydrated.      Objective:      Visit Vitals   Item Reading   ??? BP 115/76 mmHg   ??? Pulse 70   ??? Temp 97.4 ??F (36.3 ??C)   ??? Resp 18   ??? Ht 6' (1.829 m)   ??? Wt 184 lb 8 oz   ??? BMI 25.02 kg/m2   ??? SpO2 100%         Intake and Output:  Current Shift:  07/06 0701 - 07/06 1900  In: 120 [P.O.:120]  Out: 1400 [Urine:1400]  Last three shifts:  07/04 1901 - 07/06 0700  In: 292 [I.V.:292]  Out: 1800 [Urine:1800]    Physical Exam    General appearance: Alert, cooperative, no distress.  Head: Normocephalic, without obvious abnormality, atraumatic.  Eyes: Conjunctivae/corneas clear. PERRL, EOM's intact.  Ears, nose, throat: Lips, mucosa, and tongue normal. Teeth and gums normal and normal findings: oropharynx pink & moist without lesions.  Respiratory: Clear to auscultation bilaterally.  Cardiovascular: Regular rate and rhythm, S1, S2 normal, no JVD, no murmur or pedal edema.  Gastrointestinal: Soft, non-tender, non-distended, bowel sounds present, no organomegaly.  Musculoskeletal: No edema, redness or tenderness in the calves or thighs.  Vascular: 2+ and symmetric.   Skin: Skin color, texture, turgor normal. No rashes or lesions.  Neurologic: Alert and oriented X 3, normal strength and tone. Having tremors  Hematologic/Lymphatic: No ecchymosis or petechiae. No lymphadenopathy.    Psychiatric: No confusion or hallucinations.       LABS:    Recent Results (from the past 24 hour(s))   CBC WITH AUTOMATED DIFF    Collection Time: 07/02/15 10:00 PM   Result Value Ref Range    WBC 7.2 4.5 - 10.8 K/uL    RBC 3.70 (L) 4.7 - 6.1 M/uL    HGB 12.7 (L) 13.5 - 17.5 g/dL    HCT 35.8 (L) 41 - 53 %    MCV 96.8 78 - 98 FL    MCH 34.3 (H) 27 - 31 PG     MCHC 35.5 31 - 37 g/dL    RDW 12.1 11.5 - 14.5 %    PLATELET 135 130 - 400 K/uL    MPV 9.0 5.9 - 10.3 FL    NEUTROPHILS 73 40 - 74 %    LYMPHOCYTES 19 19 - 48 %    MONOCYTES 6 3 - 9 %    EOSINOPHILS 2 0 - 5 %    BASOPHILS 0 0 - 2 %    ABS. NEUTROPHILS 5.2 1.5 - 8.0 K/UL    ABS. LYMPHOCYTES 1.4 0.8 - 3.5 K/UL    ABS. MONOCYTES 0.5 (L) 0.8 - 3.5 K/UL    ABS. EOSINOPHILS 0.1 0.0 - 0.5 K/UL    ABS. BASOPHILS 0.0 0.0 - 0.1 K/UL    DF AUTOMATED     METABOLIC PANEL, BASIC  Collection Time: 07/02/15 10:00 PM   Result Value Ref Range    Sodium 123 (L) 136 - 145 mmol/L    Potassium 4.4 3.5 - 5.3 mmol/L    Chloride 87 (L) 98 - 107 mmol/L    CO2 19 (L) 21 - 32 mmol/L    Anion gap 17 (H) 6 - 15 mmol/L    Glucose 133 (H) 70 - 110 mg/dL    BUN 9 7 - 18 MG/DL    Creatinine 0.91 0.60 - 1.30 MG/DL    BUN/Creatinine ratio 10 7 - 25      GFR est AA >60 >60 ml/min/1.25m    GFR est non-AA >60 >60 ml/min/1.771m   Calcium 9.1 8.5 - 1041.3G/DL   SALICYLATE    Collection Time: 07/02/15 10:00 PM   Result Value Ref Range    SALICYLATE <2<2.4L) 2.8 - 20.0 MG/DL   ACETAMINOPHEN    Collection Time: 07/02/15 10:00 PM   Result Value Ref Range    ACETAMINOPHEN <2 (L) 10 - 30 ug/mL   ETHYL ALCOHOL    Collection Time: 07/02/15 10:00 PM   Result Value Ref Range    ALCOHOL(ETHYL),SERUM None detected MG/DL   DRUG SCREEN, URINE    Collection Time: 07/02/15 10:17 PM   Result Value Ref Range    METHADONE NEGATIVE       PCP(PHENCYCLIDINE) NEGATIVE       BENZODIAZEPINE NEGATIVE       COCAINE NEGATIVE       AMPHETAMINE NEGATIVE       OPIATES NEGATIVE       BARBITURATES NEGATIVE       TRICYCLICS NEGATIVE       THC (TH-CANNABINOL) NEGATIVE      URINALYSIS W/ RFLX MICROSCOPIC    Collection Time: 07/02/15 10:17 PM   Result Value Ref Range    Color YELLOW      Appearance CLEAR      Specific gravity 1.010 1.002 - 1.030      pH (UA) 5.0 4.5 - 8.0      Protein TRACE (A) mg/dL    Glucose NEGATIVE  mg/dL    Ketone 40 (A) NEG mg/dL    Bilirubin NEGATIVE        Blood SMALL (A)      Urobilinogen 0.2 0 - 1 EU/dL    Nitrites NEGATIVE       Leukocyte Esterase NEGATIVE      URINE MICROSCOPIC    Collection Time: 07/02/15 10:17 PM   Result Value Ref Range    WBC NONE 0 - 2 /hpf    RBC 0-2 0 - 2 /hpf    Epithelial cells NONE /lpf    Bacteria TRACE /hpf   SODIUM, UR, RANDOM    Collection Time: 07/03/15  2:17 AM   Result Value Ref Range    Sodium urine, random 22 20 - 110 MMOL/L   CBC WITH AUTOMATED DIFF    Collection Time: 07/03/15  5:11 AM   Result Value Ref Range    WBC 7.2 4.5 - 10.8 K/uL    RBC 4.01 (L) 4.7 - 6.1 M/uL    HGB 13.6 13.5 - 17.5 g/dL    HCT 38.7 (L) 41 - 53 %    MCV 96.5 78 - 98 FL    MCH 33.9 (H) 27 - 31 PG    MCHC 35.1 31 - 37 g/dL    RDW 12.2 11.5 - 14.5 %    PLATELET 151  130 - 400 K/uL    MPV 9.7 5.9 - 10.3 FL    NEUTROPHILS 57 40 - 74 %    LYMPHOCYTES 32 19 - 48 %    MONOCYTES 9 3 - 9 %    EOSINOPHILS 2 0 - 5 %    BASOPHILS 0 0 - 2 %    ABS. NEUTROPHILS 4.2 1.5 - 8.0 K/UL    ABS. LYMPHOCYTES 2.3 0.8 - 3.5 K/UL    ABS. MONOCYTES 0.6 (L) 0.8 - 3.5 K/UL    ABS. EOSINOPHILS 0.1 0.0 - 0.5 K/UL    ABS. BASOPHILS 0.0 0.0 - 0.1 K/UL    DF AUTOMATED     MAGNESIUM    Collection Time: 07/03/15  5:11 AM   Result Value Ref Range    Magnesium 2.1 1.8 - 2.4 mg/dL   PHOSPHORUS    Collection Time: 07/03/15  5:11 AM   Result Value Ref Range    Phosphorus 3.7 2.5 - 4.9 MG/DL   METABOLIC PANEL, COMPREHENSIVE    Collection Time: 07/03/15  5:11 AM   Result Value Ref Range    Sodium 134 (L) 136 - 145 mmol/L    Potassium 4.2 3.5 - 5.3 mmol/L    Chloride 98 98 - 107 mmol/L    CO2 26 21 - 32 mmol/L    Anion gap 10 6 - 15 mmol/L    Glucose 86 70 - 110 mg/dL    BUN 8 7 - 18 MG/DL    Creatinine 0.78 0.60 - 1.30 MG/DL    BUN/Creatinine ratio 10 7 - 25      GFR est AA >60 >60 ml/min/1.69m    GFR est non-AA >60 >60 ml/min/1.749m   Calcium 9.2 8.5 - 10.1 MG/DL    Bilirubin, total 0.8 <1.1 MG/DL    ALT 153 (H) 12 - 78 U/L    AST 123 (H) 15 - 37 U/L    Alk. phosphatase 55 45 - 117 U/L     Protein, total 7.2 6.4 - 8.2 g/dL    Albumin 4.0 3.4 - 5.0 g/dL    Globulin 3.2 2.4 - 3.5 g/dL    A-G Ratio 1.3 1.2 - 2.2     CORTISOL    Collection Time: 07/03/15  5:11 AM   Result Value Ref Range    Cortisol, random 12.9 ug/dL   T4    Collection Time: 07/03/15  5:11 AM   Result Value Ref Range    T4, Total 7.2 4.7 - 13.3 ug/dL   TSH, 3RD GENERATION    Collection Time: 07/03/15  5:11 AM   Result Value Ref Range    TSH 2.56 0.35 - 3.1.61IU/mL   METABOLIC PANEL, BASIC    Collection Time: 07/03/15  8:51 AM   Result Value Ref Range    Sodium 133 (L) 136 - 145 mmol/L    Potassium 4.0 3.5 - 5.3 mmol/L    Chloride 99 98 - 107 mmol/L    CO2 28 21 - 32 mmol/L    Anion gap 6 6 - 15 mmol/L    Glucose 108 70 - 110 mg/dL    BUN 7 7 - 18 MG/DL    Creatinine 0.89 0.60 - 1.30 MG/DL    BUN/Creatinine ratio 8 7 - 25      GFR est AA >60 >60 ml/min/1.7318m  GFR est non-AA >60 >60 ml/min/1.29m77m Calcium 9.1 8.5 - 10.1 MG/DL   CBC W/O DIFF  Collection Time: 07/03/15  8:51 AM   Result Value Ref Range    WBC 4.8 4.5 - 10.8 K/uL    RBC 3.96 (L) 4.7 - 6.1 M/uL    HGB 13.3 (L) 13.5 - 17.5 g/dL    HCT 38.0 (L) 41 - 53 %    MCV 96.0 78 - 98 FL    MCH 33.6 (H) 27 - 31 PG    MCHC 35.0 31 - 37 g/dL    RDW 12.1 11.5 - 14.5 %    PLATELET 139 130 - 400 K/uL    MPV 9.3 5.9 - 10.3 FL     All labs reviewed    Assessment/Plan     Patient Active Hospital Problem List:   New onset seizure ([place]) (07/02/2015)  ?? Assessment: this may be related to ETOH use, ? Related to hyponatremia, vs. Primary seizure disorder  EEG, MRI of head pending  Ativan iv prn, will not start anticonvulsants at this time  Await neurology    Hyponatremia (07/02/2015)  ?? Assessment: may be due to polydipsia or photomania  ?? Plan: restrict oral fluid intake to 1 liter a day  Serial BMP, last NA 132, on D5  Stop ETOH use    Alcohol abuse (07/02/2015)  ?? Assessment: active  ?? Plan: on thiamine, folic acid  Serax  Ativan iv prn for agitation  Consult psychiatry     HTN (hypertension) (07/02/2015)  ?? Assessment: active  ?? Plan: on lisinopril    Tobacco abuse (07/02/2015)  ?? Assessment: chronic  ?? Plan: smoking cessation, nicotine patch    Otelia Limes Deyra Perdomo, DO   10:15 AM

## 2015-07-03 NOTE — Progress Notes (Signed)
Bedside and Verbal shift change report given to Coralee NorthNina RN (oncoming nurse) by Hassan BucklerA Waddell rN (offgoing nurse). Report included the following information SBAR and Kardex.

## 2015-07-03 NOTE — Progress Notes (Signed)
TRANSFER - IN REPORT:    Verbal report received from Huntley DecSara, Charity fundraiserN (name) on Kristopher Day  being received from ED (unit) for routine progression of care      Report consisted of patient???s Situation, Background, Assessment and   Recommendations(SBAR).     Information from the following report(s) ED Summary and MAR was reviewed with the receiving nurse.    Opportunity for questions and clarification was provided.      Assessment completed upon patient???s arrival to unit and care assumed.

## 2015-07-03 NOTE — Procedures (Signed)
[facility]  ELECT[facility]ENCEPHAL[facility]    Name:  Gerda DissHARRIS, Sophie  MR #:  [id number]  Account #:  [id number]  DOB:  01-Apr-1971  Age:  [age]Y  Ref MD:  Melody ComasSMUTKO, MICHAEL R      Location: 214  Date of Service:  07/02/2015    ELECTROENCEPHALOGRAM REPORT    CLINICAL HISTORY:   This is a 44 year old male who had a seizure and was found to have  hyponatremia and alcohol abuse.      EEG DESCRIPTION:  When maximally awake, posterior dominant rhythm consisted of 9-10  hertz Alpha rhythm. Photic stimulation produced a driving response.  Drowsiness is characterized by diffuse Theta and Delta slowing.        IMPRESSION:  This is a normal awake and drowsy electroencephalogram.    CONCLUSION:  There were no epileptiform discharges or electrographic seizures  noted.       ___________________________________  Chauncy LeanSreekanth Natisha Trzcinski, M.D., Clinical Neurophysiology    SK:sp  DD:07/03/2015 11:41:54   DT: 07/03/2015 11:50:25   Job ID:  [id number]  CC:

## 2015-07-04 LAB — METABOLIC PANEL, BASIC
Anion gap: 10 mmol/L (ref 6–15)
BUN/Creatinine ratio: 9 (ref 7–25)
BUN: 8 MG/DL (ref 7–18)
CO2: 25 mmol/L (ref 21–32)
Calcium: 9.2 MG/DL (ref 8.5–10.1)
Chloride: 100 mmol/L (ref 98–107)
Creatinine: 0.93 MG/DL (ref 0.60–1.30)
GFR est AA: >60 mL/min/{1.73_m2} (ref >60)
GFR est non-AA: >60 mL/min/{1.73_m2} (ref >60)
Glucose: 105 mg/dL (ref 70–110)
Potassium: 4.1 mmol/L (ref 3.5–5.3)
Sodium: 135 mmol/L — ABNORMAL LOW (ref 136–145)

## 2015-07-04 LAB — MAGNESIUM: Magnesium: 2 mg/dL (ref 1.8–2.4)

## 2015-07-04 LAB — EKG, 12 LEAD, INITIAL
Atrial Rate: 122 {beats}/min
Calculated P Axis: 59 degrees
Calculated R Axis: 64 degrees
Calculated T Axis: 45 degrees
P-R Interval: 152 ms
Q-T Interval: 312 ms
QRS Duration: 98 ms
QTC Calculation (Bezet): 444 ms
Ventricular Rate: 122 {beats}/min

## 2015-07-04 LAB — HEPATITIS PANEL, ACUTE
Hep B Core Ab, IgM: NEGATIVE
Hep B surface Ag screen: NEGATIVE
Hep C Virus Ab: <0.1 s/co ratio (ref 0.0–0.9)
Hepatitis A Ab, IgM: NEGATIVE

## 2015-07-04 LAB — HEPATIC FUNCTION PANEL
A-G Ratio: 1.2 (ref 1.2–2.2)
ALT (SGPT): 148 U/L — ABNORMAL HIGH (ref 12–78)
AST (SGOT): 113 U/L — ABNORMAL HIGH (ref 15–37)
Albumin: 3.8 g/dL (ref 3.4–5.0)
Alk. phosphatase: 51 U/L (ref 45–117)
Bilirubin, direct: 0.1 MG/DL (ref 0.0–0.2)
Bilirubin, total: 0.7 MG/DL (ref <1.1)
Globulin: 3.3 g/dL (ref 2.4–3.5)
Protein, total: 7.1 g/dL (ref 6.4–8.2)

## 2015-07-04 LAB — PHOSPHORUS: Phosphorus: 4 MG/DL (ref 2.5–4.9)

## 2015-07-04 MED ORDER — PHARMACY INFORMATION NOTE
Freq: Every day | Status: DC | PRN
Start: 2015-07-04 — End: 2015-07-05

## 2015-07-04 MED ORDER — LEVETIRACETAM 500 MG TAB
500 mg | ORAL | Status: AC
Start: 2015-07-04 — End: 2015-07-04
  Administered 2015-07-04: 01:00:00

## 2015-07-04 MED FILL — OXAZEPAM 10 MG CAP: 10 mg | ORAL | Qty: 1

## 2015-07-04 MED FILL — DEXTROSE 5% IN WATER (D5W) IV: INTRAVENOUS | Qty: 1000

## 2015-07-04 MED FILL — PANTOPRAZOLE 40 MG TAB, DELAYED RELEASE: 40 mg | ORAL | Qty: 1

## 2015-07-04 MED FILL — VITAMIN B-1 100 MG TABLET: 100 mg | ORAL | Qty: 1

## 2015-07-04 MED FILL — PHARMACY INFORMATION NOTE: Qty: 1

## 2015-07-04 MED FILL — FOLIC ACID 1 MG TAB: 1 mg | ORAL | Qty: 1

## 2015-07-04 MED FILL — LEVETIRACETAM 250 MG TAB: 250 mg | ORAL | Qty: 1

## 2015-07-04 MED FILL — LEVETIRACETAM 500 MG TAB: 500 mg | ORAL | Qty: 1

## 2015-07-04 MED FILL — LORAZEPAM 2 MG/ML IJ SOLN: 2 mg/mL | INTRAMUSCULAR | Qty: 1

## 2015-07-04 MED FILL — NICOTINE 21 MG/24 HR DAILY PATCH: 21 mg/24 hr | TRANSDERMAL | Qty: 1

## 2015-07-04 MED FILL — LISINOPRIL 10 MG TAB: 10 mg | ORAL | Qty: 2

## 2015-07-04 NOTE — Progress Notes (Signed)
Bedside and Verbal shift change report given to Jennifer RN (oncoming nurse) by Rachael RN (offgoing nurse). Report included the following information SBAR, Kardex and MAR.

## 2015-07-04 NOTE — Progress Notes (Signed)
Message left with Intake Coordinator, will await return call.

## 2015-07-04 NOTE — Progress Notes (Signed)
Intake Coordinator returned call and informed staff that pt will need discharge orders in AM to [facility].  Charge nurse, Eunice Blaseebbie, notified by staff.  Pt updated on plan of care.  No further questions at this time.

## 2015-07-04 NOTE — Progress Notes (Signed)
Pt requesting something to help him relax and rest; [facility], will cont to monitor, call light in reach.

## 2015-07-04 NOTE — Progress Notes (Signed)
[facility]  Follow-up Progress Note      Patient: Kristopher Day           Sex: male          MRN: [id number]  Date of Birth:  1971/04/17       Age:  44 y.o.                  Length of Stay: 2    Subjective:     Chief Complaint: Seizure    Clinical History: 44 y.o. male admitted after having new onset seizure noted to have severe hyponatremia. Duration of hyponatremia is unknown. Patient admitted to heavy ethanol intake that stopped on day of seizure. He ingested large quantity of liquid on day of seizure. Sodium was 123 meq/L on day of admission. Sodium increased to 134 meq/L seven hours later after administration of isotonic saline. Intravenous fluid was changed to 5% dextrose in water. Sodium ranged from 132 meq/L to 135 meq/L over next 24 hours.     Only outpatient medications were lisinopril and over the counter non-steroidal anti-inflammatory drug.    Interval History: No more seizure activity has occurred. Level of consciousness was normal. He is eating well. He offers no complaints of pain, nausea,or shortness of breath today.      Objective:      Filed Vitals:    07/04/15 0730 07/04/15 0800 07/04/15 1501 07/04/15 1916   BP: 109/66  122/82 115/78   Pulse: 66 70 79 82   Temp: 97.3 ??F (36.3 ??C)  98.2 ??F (36.8 ??C) 97.8 ??F (36.6 ??C)   Resp: 16  16 16    Height:       Weight:       SpO2: 98%  100% 99%     Wt Readings from Last 3 Encounters:   07/03/15 83.689 kg (184 lb 8 oz)   07/03/15 79.379 kg (175 lb)       Intake/Output Summary (Last 24 hours) at 07/04/15 1919  Last data filed at 07/04/15 1916   Gross per 24 hour   Intake      0 ml   Output    975 ml   Net   -975 ml     Physical Examination:   Constitutional: Well developed and well nourished male in no acute distress.   HENT: Normocephalic, Atraumatic, Bilateral external ears normal, Oropharynx moist, No oral exudates, Nose normal.   Eyes: Sclerae clear, Conjunctiva normal, No discharge.    Neck: Normal range of motion, No tenderness, Supple, No stridor.   Lymphatic: No lymphadenopathy noted.   Cardiovascular: Normal heart rate, Normal rhythm, No murmur or pericardial friction rub audible.   Thorax & Lungs: Clear to auscultation, Normal breath sounds, No respiratory distress, No wheezing, No chest tenderness.   Abdomen: No bruits audible, Soft, No tenderness, No masses, No hepatosplenomegaly.  Skin: Warm, Dry, No erythema, No rash.   Extremities: Digits well perfused, No edema, No tenderness, No cyanosis, No clubbing.     Lab/Data Reviewed:  Recent Labs      07/04/15   0520  07/03/15   1651  07/03/15   1254  07/03/15   0851  07/03/15   0511  07/02/15   2200   WBC   --    --    --   4.8  7.2  7.2   HGB   --    --    --   13.3*  13.6  12.7*  HCT   --    --    --   38.0*  38.7*  35.8*   PLT   --    --    --   139  151  135   NA  135*  132*  132*  133*  134*  123*   K  4.1  4.0  4.0  4.0  4.2  4.4   CL  100  99  96*  99  98  87*   CO2  19*   GLU  105  127*  96  108  86  133*   BUN  CREA  0.93  0.82  1.02  0.89  0.78  0.91   CA  9.2  9.3  9.6  9.1  9.2  9.1   MG  2.0   --    --    --   2.1   --    PHOS  4.0   --    --    --   3.7   --    ALB  3.8   --    --    --   4.0   --    TBILI  0.7   --    --    --   0.8   --      Medications Reviewed  Assessment/Plan:     Kristopher Day is a 44 y.o. male  currently being followed for evaluation and management of hyponatremia in setting of alcoholism and excessive fluid ingestion.    Hyponatremia may have been due to inadequate solute intake in setting of large liquid intake. Hyponatremia is resolving following solute administration. Shall discontinue intravenous fluid.    Blood pressure is within target range on current dosage of lisinopril.    He is now agreeable to undergoing inpatient detoxification.    His current medical regimen is appropriate.    Electronically signed:  Claretta Fraise, MD    [facility]   07/04/2015

## 2015-07-04 NOTE — Progress Notes (Signed)
Pt resting in bed, no s/s of distress noted. Call light within reach. Hourly rounding continued, 24 hr chart check complete. Heart monitor is on. Will continue to monitor, give the report to the oncoming shift.

## 2015-07-04 NOTE — Progress Notes (Signed)
Bedside and Verbal shift change report given to Rachael, RN (oncoming nurse) by Nina, RN (offgoing nurse). Report included the following information SBAR, Kardex and ED Summary.

## 2015-07-04 NOTE — ED Provider Notes (Signed)
Patient is a 44 y.o. male presenting with seizures. The history is provided by the patient.   Seizure   This is a new (Pt allegedly had witnessed seizure today x 1.  He has gone without drinking ETOH today for the fiorst time in years and has substitiuted water for vodka.  He was outsifde and became hot,.  Woke up on the ground.  No somatic complaints at this time. ) problem. The problem has been resolved. The most recent episode lasted 30 to 120 seconds. Pertinent negatives include no headaches, no chest pain, no cough, no nausea and no vomiting. Characteristics include rhythmic jerking.  The seizure(s) had no focality. There has been no fever.  He reports no chest pain, no vomiting, no headaches and no cough. There were no medications administered prior to arrival.        History reviewed. No pertinent past medical history.    History reviewed. No pertinent past surgical history.      History reviewed. No pertinent family history.    History     Social History   ??? Marital Status: MARRIED     Spouse Name: N/A   ??? Number of Children: N/A   ??? Years of Education: N/A     Occupational History   ??? Not on file.     Social History Main Topics   ??? Smoking status: Current Every Day Smoker -- 1.00 packs/day for 24 years   ??? Smokeless tobacco: Not on file   ??? Alcohol Use: Yes      Comment: i used to drink beer, but i started on vodka   ??? Drug Use: Not on file   ??? Sexual Activity: Not on file     Other Topics Concern   ??? Not on file     Social History Narrative   ??? No narrative on file         ALLERGIES: Review of patient's allergies indicates no known allergies.    Review of Systems   Constitutional: Negative for fever and chills.   HENT: Negative for congestion, ear pain and postnasal drip.    Eyes: Negative for pain.   Respiratory: Negative for cough and shortness of breath.    Cardiovascular: Negative for chest pain and palpitations.   Gastrointestinal: Negative for nausea, vomiting and abdominal pain.    Genitourinary: Negative for dysuria and hematuria.   Musculoskeletal: Negative for back pain.   Skin: Negative for rash.   Neurological: Positive for seizures. Negative for weakness and headaches.       Filed Vitals:    07/04/15 0518 07/04/15 0730 07/04/15 0800 07/04/15 1501   BP:  109/66  122/82   Pulse: 55 66 70 79   Temp:  97.3 ??F (36.3 ??C)  98.2 ??F (36.8 ??C)   Resp:  16  16   Height:       Weight:       SpO2:  98%  100%            Physical Exam   Constitutional: He is oriented to person, place, and time. He appears well-developed and well-nourished.   HENT:   Head: Normocephalic.   Right Ear: External ear normal.   Left Ear: External ear normal.   Nose: Nose normal.   Mouth/Throat: Oropharynx is clear and moist.   No oral bites marks   Eyes: EOM are normal. Pupils are equal, round, and reactive to light.   Neck: Normal range of motion. Neck supple.  Cardiovascular: Normal rate, regular rhythm and normal heart sounds.    Pulmonary/Chest: Effort normal. He has no wheezes. He has no rales.   Abdominal: Soft. Bowel sounds are normal. There is no tenderness.   Musculoskeletal: Normal range of motion. He exhibits no edema.   Lymphadenopathy:     He has no cervical adenopathy.   Neurological: He is alert and oriented to person, place, and time.   Skin: Skin is warm and dry. No rash noted.   Psychiatric: He has a normal mood and affect.   Nursing note and vitals reviewed.       MDM  Number of Diagnoses or Management Options  Convulsions, unspecified convulsion type [facility](HCC):   Hyponatremia:      Amount and/or Complexity of Data Reviewed  Clinical lab tests: ordered and reviewed  Tests in the radiology section of CPT??: ordered and reviewed  Discuss the patient with other providers: yes        Procedures

## 2015-07-04 NOTE — Progress Notes (Signed)
Pt sitting up in bed eating breakfast. No s/s of distress noted and pt without complaints. Assessment complete. Will continue to monitor.

## 2015-07-04 NOTE — Progress Notes (Signed)
Progress Note      Patient: Kristopher Day               Sex: male          MRN: [id number]           Date of Birth:  1971/04/17      Age:  44 y.o.                    PCP:  Marijo FileBARBARA HALE, NP  Treatment Team: Attending Provider: Melody ComasMichael R Smutko, DO; Consulting Provider: Elenora GammaJonathan Kendall, MD; Consulting Provider: Chauncy LeanSreekanth Koneru, MD; Consulting Provider: Conard NovakAyobola A Oloworaran, MD  Subjective:     No complaints at present.  States he feels well, no further seizures.  States he no longer wants to drink alcohol but is not sure if he wants to go to the inpatient unit.  States he is afraid if he goes to the detox unit he will have another seizure.      Review of Systems negative with exception of pertinent positives noted above    Objective:   Physical Exam:   Visit Vitals   Item Reading   ??? BP 109/66 mmHg   ??? Pulse 66   ??? Temp 97.3 ??F (36.3 ??C)   ??? Resp 16   ??? Ht 6' (1.829 m)   ??? Wt 184 lb 8 oz   ??? BMI 25.02 kg/m2   ??? SpO2 98%      Temp (24hrs), Avg:97.9 ??F (36.6 ??C), Min:97.3 ??F (36.3 ??C), Max:98.2 ??F (36.8 ??C)    Oxygen Therapy  O2 Sat (%): 98 % (07/04/15 0730)  O2 Device: Room air (07/04/15 0730)    Intake/Output Summary (Last 24 hours) at 07/04/15 0843  Last data filed at 07/04/15 0427   Gross per 24 hour   Intake    240 ml   Output   1150 ml   Net   -910 ml      General: No acute distress, Alert and Ox3  HEENT:           Atraumatic without icterus,PERRL,OM Moist  Neck:??               Supple without adenopathy  Lungs:  CTA Bilaterally.   Heart:  Regular rate and rhythm,?? No murmur, rub, or gallop  Abdomen: Soft, Non distended, Non tender, Positive bowel sounds  Extremities: No cyanosis, clubbing or edema  Neurologic:?? No focal deficits  Skin:                No rashes,excessive bruising or petechiae   Musculoskeletal: No joint or muscular tenderness to palpation  Lymphatics:     No cervical,axillary or inguinal adenopathy  Psych:             Appropriate mood and affect    LAB  Recent Labs      07/03/15   0851  07/03/15    0511   WBC  4.8  7.2   HGB  13.3*  13.6   HCT  38.0*  38.7*   PLT  139  151     Recent Labs      07/04/15   0520  07/03/15   1651   07/03/15   0511   NA  135*  132*   < >  134*   K  4.1  4.0   < >  4.2   CL  100  99   < >  98   CO2  25  24   < >  26   GLU  105  127*   < >  86   BUN  8  9   < >  8   CREA  0.93  0.82   < >  0.78   MG  2.0   --    --   2.1   PHOS  4.0   --    --   3.7    < > = values in this interval not displayed.         Radiology films personally reviewed    Medications Reviewed      Assessment/Plan     Principal Problem:    New onset seizure (HCC) (07/02/2015)   Possibly due to alcohol cessation and hyponatremia.   On keppra at present.    Active Problems:    Hyponatremia (07/02/2015)   Resolved with IV hydration.      Alcohol abuse (07/02/2015)   Discussed with patient.   Will decide whether he wants inpatient treatment.      HTN (hypertension) (07/02/2015)   At goal at present.      Tobacco abuse (07/02/2015)   On nicotine replacement.      Transaminitis (07/03/2015)   Most likely due to alcohol intake.   Will monitor.      ??

## 2015-07-04 NOTE — Progress Notes (Signed)
Dr. Val Eagle notified of pts decision to [facility].  Orders received to notify the Intake staff.  Pt updated on plan of care.

## 2015-07-04 NOTE — Progress Notes (Signed)
Psychiatry Progress Note    Date: 07/04/2015  Account Number:  [id number]  Name: Kristopher Day      Subjective:     Patient seen. He is still having withdrawal symptoms. [facility]. He said he has 2 children in the house. He was educated on the benefit of inpatient detox. He denies psychosis    Patient Active Problem List    Diagnosis Date Noted   ??? Transaminitis 07/03/2015   ??? New onset seizure (HCC) 07/02/2015   ??? Hyponatremia 07/02/2015   ??? Alcohol abuse 07/02/2015   ??? HTN (hypertension) 07/02/2015   ??? Tobacco abuse 07/02/2015     History reviewed. No pertinent past surgical history.   No Known Allergies   History   Substance Use Topics   ??? Smoking status: Current Every Day Smoker -- 1.00 packs/day for 24 years   ??? Smokeless tobacco: Not on file   ??? Alcohol Use: Yes      Comment: i used to drink beer, but i started on vodka      History reviewed. No pertinent family history.   Marijo FileBARBARA HALE, NP        Objective:         Patient Vitals for the past 8 hrs:   BP Temp Pulse Resp SpO2   07/04/15 1501 122/82 mmHg 98.2 ??F (36.8 ??C) 79 16 100 %         Mental Status exam:     Pt appears stated age, kempt and casually dressed. Fair eye contact.  Psychomotor neutral. AAO X 3. Fair memory, concentration & attention span  Speech is articulated, normal rate, volume and quantity  Mood is anxious and affect is appropriate  TP is goal directed. TC No delusion, No suicidal/homicidal ideation, intent or plan  Perception, no AVH. Average intelligence and fund of knowledge  Impulse control, Insight and judgement is fair          Therapy notes nursing notes and labs in the past 24 hours reviewed and appreciated    Assessment/Plan:   Principal Problem:    New onset seizure (HCC) (07/02/2015)    Active Problems:    Hyponatremia (07/02/2015)      Alcohol abuse (07/02/2015)      HTN (hypertension) (07/02/2015)      Tobacco abuse (07/02/2015)      Transaminitis (07/03/2015)          Assessment     Patient is refusing inpatient BH detox. Dangers of alcohol use discussed with patient. He verbalized adequate understanding.       Recommendation.    Therapeutic Milieu  DVT Prophylaxis Ambulation  Call Intake to set up out patient substance abuse follow up with patient and to educate patient on preventive community substance abuse treatment program.     Medications:    Current Facility-Administered Medications   Medication Dose Route Frequency   ??? sodium chloride (NS) flush 5-10 mL  5-10 mL IntraVENous Q8H   ??? sodium chloride (NS) flush 5-10 mL  5-10 mL IntraVENous PRN   ??? acetaminophen (TYLENOL) tablet 650 mg  650 mg Oral Q6H PRN   ??? nicotine (NICODERM CQ) 21 mg/24 hr patch 1 Patch  1 Patch TransDERmal Q24H   ??? oxazepam (SERAX) capsule 10 mg  10 mg Oral TID   ??? LORazepam (ATIVAN) injection 1 mg  1 mg IntraVENous Q4H PRN   ??? thiamine (B-1) tablet 100 mg  100 mg Oral DAILY   ???  folic acid (FOLVITE) tablet 1 mg  1 mg Oral DAILY   ??? pantoprazole (PROTONIX) tablet 40 mg  40 mg Oral ACB   ??? lisinopril (PRINIVIL, ZESTRIL) tablet 20 mg  20 mg Oral BID   ??? dextrose 5% infusion  75 mL/hr IntraVENous CONTINUOUS   ??? levETIRAcetam (KEPPRA) tablet 250 mg  250 mg Oral BID   ??? Dr Monte Fantasia Sliding Scale Electrolyte Replacement Protocol   1 Each Other DAILY PRN           Signed By: Conard Novak, MD

## 2015-07-05 LAB — METABOLIC PANEL, BASIC
Anion gap: 9 mmol/L (ref 6–15)
BUN/Creatinine ratio: 8 (ref 7–25)
BUN: 7 MG/DL (ref 7–18)
CO2: 26 mmol/L (ref 21–32)
Calcium: 9 MG/DL (ref 8.5–10.1)
Chloride: 102 mmol/L (ref 98–107)
Creatinine: 0.88 MG/DL (ref 0.60–1.30)
GFR est AA: >60 mL/min/{1.73_m2} (ref >60)
GFR est non-AA: >60 mL/min/{1.73_m2} (ref >60)
Glucose: 93 mg/dL (ref 70–110)
Potassium: 3.9 mmol/L (ref 3.5–5.3)
Sodium: 137 mmol/L (ref 136–145)

## 2015-07-05 LAB — HEPATIC FUNCTION PANEL
A-G Ratio: 1.3 (ref 1.2–2.2)
ALT (SGPT): 179 U/L — ABNORMAL HIGH (ref 12–78)
AST (SGOT): 134 U/L — ABNORMAL HIGH (ref 15–37)
Albumin: 3.8 g/dL (ref 3.4–5.0)
Alk. phosphatase: 50 U/L (ref 45–117)
Bilirubin, direct: 0.1 MG/DL (ref 0.0–0.2)
Bilirubin, total: 0.4 MG/DL (ref <1.1)
Globulin: 2.9 g/dL (ref 2.4–3.5)
Protein, total: 6.7 g/dL (ref 6.4–8.2)

## 2015-07-05 LAB — MAGNESIUM: Magnesium: 1.9 mg/dL (ref 1.8–2.4)

## 2015-07-05 LAB — PHOSPHORUS: Phosphorus: 4.1 MG/DL (ref 2.5–4.9)

## 2015-07-05 LAB — OSMOLALITY, UR: Osmolality, Urine: 69 mOsmol/kg

## 2015-07-05 MED ORDER — LEVETIRACETAM 750 MG TAB
750 mg | ORAL_TABLET | Freq: Two times a day (BID) | ORAL | Status: AC
Start: 2015-07-05 — End: ?

## 2015-07-05 MED ORDER — NICOTINE 21 MG/24 HR DAILY PATCH
21 mg/24 hr | MEDICATED_PATCH | TRANSDERMAL | Status: AC
Start: 2015-07-05 — End: 2015-08-04

## 2015-07-05 MED FILL — LISINOPRIL 10 MG TAB: 10 mg | ORAL | Qty: 2

## 2015-07-05 MED FILL — OXAZEPAM 10 MG CAP: 10 mg | ORAL | Qty: 1

## 2015-07-05 MED FILL — VITAMIN B-1 100 MG TABLET: 100 mg | ORAL | Qty: 1

## 2015-07-05 MED FILL — PANTOPRAZOLE 40 MG TAB, DELAYED RELEASE: 40 mg | ORAL | Qty: 1

## 2015-07-05 MED FILL — LEVETIRACETAM 250 MG TAB: 250 mg | ORAL | Qty: 1

## 2015-07-05 MED FILL — NICOTINE 21 MG/24 HR DAILY PATCH: 21 mg/24 hr | TRANSDERMAL | Qty: 1

## 2015-07-05 MED FILL — LORAZEPAM 2 MG/ML IJ SOLN: 2 mg/mL | INTRAMUSCULAR | Qty: 1

## 2015-07-05 MED FILL — FOLIC ACID 1 MG TAB: 1 mg | ORAL | Qty: 1

## 2015-07-05 NOTE — Progress Notes (Signed)
TRANSFER - OUT REPORT:    Verbal report given to Amy,RN(name) on Kristopher Day  [facility] routine progression of care       Report consisted of patient???s Situation, Background, Assessment and   Recommendations(SBAR).     Information from the following report(s) SBAR, Procedure Summary, Intake/Output, MAR and Recent Results was reviewed with the receiving nurse.    Lines:   Peripheral IV 07/02/15 Left Forearm (Active)   Site Assessment Clean, dry, & intact 07/04/2015  7:15 PM   Phlebitis Assessment 0 07/04/2015  7:15 PM   Infiltration Assessment 0 07/04/2015  7:15 PM   Dressing Status Clean, dry, & intact 07/04/2015  7:15 PM   Dressing Type Tape 07/04/2015  7:15 PM   Hub Color/Line Status Infusing 07/04/2015  7:15 PM   Alcohol Cap Used Yes 07/04/2015  7:15 PM        Opportunity for questions and clarification was provided.      Patient transported with:   [employer]

## 2015-07-05 NOTE — Progress Notes (Signed)
Discharge complete. Patient escorted out [facility] staff by wheelchair. Transportation home per private vehicle. Discharge instructions, education, follow up appointments and prescriptions given to patient. No questions or concerns. All of patients belongings left with patient.     IV removed

## 2015-07-05 NOTE — Progress Notes (Signed)
Bedside and Verbal shift change report given to Vicki RN (oncoming nurse) by Jennifer and Larisa RN (offgoing nurse). Report included the following information SBAR, Kardex, MAR and Recent Results.

## 2015-07-05 NOTE — Discharge Summary (Addendum)
Hospitalist Discharge Summary     Patient ID:  Kristopher Day  [id number]  44 y.o.  [birth year]  Admit date: 07/02/2015  Discharge date and time: 07/05/2015  Attending: Edger House, DO  PCP:  Natalia Leatherwood, NP  Treatment Team: Attending Provider: Edger House, DO; Consulting Provider: Rhodia Albright, MD; Consulting Provider: Tommie Raymond, MD; Consulting Provider: Jeris Penta, MD  Admission  Diagnosis New onset seizure [facility])    Principal Diagnosis New onset seizure [facility])   Discharge Diagnosis   Hospital Problems as of 07/05/2015  Date Reviewed: 2015-08-01          Codes Class Noted - Resolved POA    Transaminitis ICD-10-CM: R74.0  ICD-9-CM: 790.4  August 01, 2015 - Present Yes        * (Principal)New onset seizure ([place]) ICD-10-CM: R56.9  ICD-9-CM: 780.39  07/02/2015 - Present Yes        Hyponatremia ICD-10-CM: E87.1  ICD-9-CM: 276.1  07/02/2015 - Present Yes        Alcohol abuse ICD-10-CM: F10.10  ICD-9-CM: 305.00  07/02/2015 - Present Yes        HTN (hypertension) ICD-10-CM: I10  ICD-9-CM: 401.9  07/02/2015 - Present Yes        Tobacco abuse ICD-10-CM: Z72.0  ICD-9-CM: 305.1  07/02/2015 - Present Yes                  Hospital Course:  Please refer to the admission H&P for details of presentation. In summary, the patient was admitted with New onset seizure [facility])    The patient was admitted with a new onset seizure; he had abruptly stopped drinking alcohol several days before.  He was also noted to have hyponatremia at that time.  He was treated with keppra and neurology was consulted.  He was also treated with free water.  Nephrology was consulted for help with his hyponatremia.  Over the course of admission, his sodium level increased and he had no further seizures.  EEG was normal and he was started on keppra.  He initially wanted to complete his detox in the behavioral unit, but then decided to go home.  He will be discharged with a prescription for keppra and will followup with his PCP.       Significant Diagnostic Studies:     Lab/Data Reviewed:  Recent Results (from the past 48 hour(s))   METABOLIC PANEL, BASIC    Collection Time: 2015/08/01  8:51 AM   Result Value Ref Range    Sodium 133 (L) 136 - 145 mmol/L    Potassium 4.0 3.5 - 5.3 mmol/L    Chloride 99 98 - 107 mmol/L    CO2 28 21 - 32 mmol/L    Anion gap 6 6 - 15 mmol/L    Glucose 108 70 - 110 mg/dL    BUN 7 7 - 18 MG/DL    Creatinine 0.89 0.60 - 1.30 MG/DL    BUN/Creatinine ratio 8 7 - 25      GFR est AA >60 >60 ml/min/1.19m    GFR est non-AA >60 >60 ml/min/1.787m   Calcium 9.1 8.5 - 10.1 MG/DL   CBC W/O DIFF    Collection Time: 0708/04/20168:51 AM   Result Value Ref Range    WBC 4.8 4.5 - 10.8 K/uL    RBC 3.96 (L) 4.7 - 6.1 M/uL    HGB 13.3 (L) 13.5 - 17.5 g/dL    HCT 38.0 (L) 41 - 53 %  MCV 96.0 78 - 98 FL    MCH 33.6 (H) 27 - 31 PG    MCHC 35.0 31 - 37 g/dL    RDW 12.1 11.5 - 14.5 %    PLATELET 139 130 - 400 K/uL    MPV 9.3 5.9 - 10.3 FL   HEPATITIS PANEL, ACUTE    Collection Time: 07/03/15  8:51 AM   Result Value Ref Range    Hepatitis A Ab, IgM NEGATIVE  NEGATIVE      Hep B surface Ag screen NEGATIVE  NEGATIVE      Hep B Core Ab, IgM NEGATIVE  NEGATIVE      Hep C Virus Ab <0.1 0.0 - 0.9 s/co ratio   METABOLIC PANEL, BASIC    Collection Time: 07/03/15 12:54 PM   Result Value Ref Range    Sodium 132 (L) 136 - 145 mmol/L    Potassium 4.0 3.5 - 5.3 mmol/L    Chloride 96 (L) 98 - 107 mmol/L    CO2 30 21 - 32 mmol/L    Anion gap 6 6 - 15 mmol/L    Glucose 96 70 - 110 mg/dL    BUN 8 7 - 18 MG/DL    Creatinine 1.02 0.60 - 1.30 MG/DL    BUN/Creatinine ratio 8 7 - 25      GFR est AA >60 >60 ml/min/1.79m    GFR est non-AA >60 >60 ml/min/1.724m   Calcium 9.6 8.5 - 1095.2G/DL   METABOLIC PANEL, BASIC    Collection Time: 07/03/15  4:51 PM   Result Value Ref Range    Sodium 132 (L) 136 - 145 mmol/L    Potassium 4.0 3.5 - 5.3 mmol/L    Chloride 99 98 - 107 mmol/L    CO2 24 21 - 32 mmol/L    Anion gap 9 6 - 15 mmol/L    Glucose 127 (H) 70 - 110 mg/dL     BUN 9 7 - 18 MG/DL    Creatinine 0.82 0.60 - 1.30 MG/DL    BUN/Creatinine ratio 11 7 - 25      GFR est AA >60 >60 ml/min/1.7351m  GFR est non-AA >60 >60 ml/min/1.44m46m Calcium 9.3 8.5 - 10.1 MG/DL   HEPATIC FUNCTION PANEL    Collection Time: 07/04/15  5:20 AM   Result Value Ref Range    Protein, total 7.1 6.4 - 8.2 g/dL    Albumin 3.8 3.4 - 5.0 g/dL    Globulin 3.3 2.4 - 3.5 g/dL    A-G Ratio 1.2 1.2 - 2.2      Bilirubin, total 0.7 <1.1 MG/DL    Bilirubin, direct 0.1 0.0 - 0.2 MG/DL    Alk. phosphatase 51 45 - 117 U/L    AST 113 (H) 15 - 37 U/L    ALT 148 (H) 12 - 78 U/L   PHOSPHORUS    Collection Time: 07/04/15  5:20 AM   Result Value Ref Range    Phosphorus 4.0 2.5 - 4.9 MG/DL   MAGNESIUM    Collection Time: 07/04/15  5:20 AM   Result Value Ref Range    Magnesium 2.0 1.8 - 2.4 mg/dL   METABOLIC PANEL, BASIC    Collection Time: 07/04/15  5:20 AM   Result Value Ref Range    Sodium 135 (L) 136 - 145 mmol/L    Potassium 4.1 3.5 - 5.3 mmol/L    Chloride 100 98 - 107 mmol/L  CO2 25 21 - 32 mmol/L    Anion gap 10 6 - 15 mmol/L    Glucose 105 70 - 110 mg/dL    BUN 8 7 - 18 MG/DL    Creatinine 0.93 0.60 - 1.30 MG/DL    BUN/Creatinine ratio 9 7 - 25      GFR est AA >60 >60 ml/min/1.74m    GFR est non-AA >60 >60 ml/min/1.752m   Calcium 9.2 8.5 - 10.1 MG/DL         Discharge Exam:  BP 111/77 mmHg   Pulse 70   Temp(Src) 98.2 ??F (36.8 ??C)   Resp 14   Ht 6' (1.829 m)   Wt 184 lb 8 oz   BMI 25.02 kg/m2   SpO2 99%  General appearance: alert, cooperative, no distress, appears stated age  Lungs: clear to auscultation bilaterally  Heart: regular rate and rhythm, S1, S2 normal, no murmur, click, rub or gallop  Abdomen: soft, non-tender. Bowel sounds normal. No masses,  no organomegaly  Extremities: extremities normal, atraumatic, no cyanosis or edema  Neurologic: Grossly normal    Disposition: home  Discharge Condition: stable  Patient Instructions:   Current Discharge Medication List       CONTINUE these medications which have NOT CHANGED    Details   lisinopril (PRINIVIL, ZESTRIL) 20 mg tablet Take  by mouth two (2) times a day. Indications: HYPERTENSION      diclofenac (VOLTAREN) 1 % gel Apply 4 g to affected area as needed. Indications: OSTEOARTHRITIS      diclofenac EC (VOLTAREN) 75 mg EC tablet Take 75 mg by mouth daily. Indications: OSTEOARTHRITIS             Activity: Activity as tolerated  Diet: Regular Diet  Wound Care: None needed    Follow-up  ?? Patient to be discharged home  ?? Keppra BID for 6 months  ?? Followup with PCP    Time spent to discharge patient greater than 30 minutes  Signed:  BeJani FilesMD  07/05/2015  5:51 AM

## 2015-07-05 NOTE — Progress Notes (Signed)
Problem: Falls - Risk of  Goal: *Absence of falls  Outcome: Progressing Towards Goal  Patient will be fall free this admission  Goal: *Knowledge of fall prevention  Outcome: Progressing Towards Goal  Patient educated on need to call for assistance.

## 2017-03-04 ENCOUNTER — Inpatient Hospital Stay: Admit: 2017-03-04 | Payer: MEDICAID | Primary: Family

## 2017-03-04 DIAGNOSIS — Z5189 Encounter for other specified aftercare: Secondary | ICD-10-CM

## 2017-03-04 NOTE — Progress Notes (Addendum)
Physical Therapy Back Evaluation    Patient Information:  Kristopher Day   [birth year]  [id number]     Referring Provider: Launa Grill, MD   ICD-10: Treatment Diagnosis: back pain  Date of onset:  Patient states the problem has been ongoing for the past two and a half years.   Date of surgery: Patient has follow up with his doctor in three weeks to schedule procedure.   Orders:  Eval and Treat   Been previously seen in PT this calendar year?:No     No past medical history on file.  No past surgical history on file. 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 levETIRAcetam (KEPPRA) 750 mg tablet Take 1 Tab by mouth two (2) times a day. 60 Tab 0   ??? lisinopril (PRINIVIL, ZESTRIL) 20 mg tablet Take  by mouth two (2) times a day. Indications: HYPERTENSION     ??? diclofenac (VOLTAREN) 1 % gel Apply 4 g to affected area as needed. Indications: OSTEOARTHRITIS     ??? diclofenac EC (VOLTAREN) 75 mg EC tablet Take 75 mg by mouth daily. Indications: OSTEOARTHRITIS       No current facility-administered medications on file prior to encounter.      No Known Allergies  Are you currently taking any prescription medications, over-the-counter medications, or herbal supplements? See above  Have you ever had any allergic or adverse reactions to any medications? See above    SUBJECTIVE:  Kristopher Day is a 46 y.o. male presenting to Physical Therapy with a chief complaint of low back pain that radiates down the R leg to the digits. The pain has ranged from 9/10 at worst to 3/10 at best in the past two years.  Patient reports numbness/tingling/ and burning throughout R LE.  Patient additionally denies locking, catching, clicking, popping. Patient is currently unemployed and lives alone. Patient has stairs in his home but rarely uses them, however he has no problems with stairs.      Mechanism of injury: Patient can recall no specific incident or injury  Pain/Symptom Location & description: Patient describes pain in low back as deep pressure; pain in R calf as a constant ache; and pain in ankle/digits as a shooting sensation   Functional Limitations/aggravating activities:  []               Stairs    [x]               Prolonged Sitting   [x]               Prolonged Standing   []               Walking   []               Driving  [x]               Household Chores  []               Bending   []               Lifting   []               Balance  [x]               Other (Sneezing and Coughing)     Alleviating: Patient states relief with pain medication (Voltaren) and movement.  Sleep quality:  Patient reports averaging  3-4 hours of sleep per night due to discomfort and pain.   Exercise:  No specific, formal exercise program at this time for this problem.    PLOF:  Patient previously able to perform all functional activities without significant pain.   Patient Goals:  Resolution of pain, return to PLOF   Imaging:  Patient recently underwent MRI with results showing pathology at L4/L5/S1 region.     OBJECTIVE:  SAD PERSONS Questionnaire    S=Sex Is the patient male? yes Yes=1 point   A=Age Is the patient <6 or >65 years old? no Yes=1 point   D=Depression or hopelessness Does the patient report depression or appear excessively sad? no Yes=2 points   P= Previous Attempts or Psychiatric Care Has the patient had a prior psychiatric admission? no Yes=1 point   E= Excessive Drug or Alcohol use Does the patient admit to excessive drug use? no Yes=1 point   R= Rational Thinking Loss Does the patient exhibit rational thinking loss? no Yes=2 points   S= Separated, Divorced, or Widowed Is the patient separated, divorced, or widowed? yes Yes=1 point   O= Organized or serious attempt Does the patient report an organized or serious attempt at suicide? no Yes=2 points    N= No social supports Does the patient deny any close family, friends,  job, or active religious affiliation? no Yes=1 point   S= Stated future intent Does the patient state a future suicide intent? no Yes=2 points   Total points 2   Required Response:  ? Total score of 0-2??no response required  ? Total score of >3??contact the Behavioral Health intake coordinator & document date, time, & person contacted    Fall Risk Assessment: Timed Up-and-Go 11 seconds  Seconds Rating Follow Up Requirement   <10 Freely Mobile No follow up required   11-20 Mostly Independent No follow up required   20-29  Variable Mobility Follow up testing at physical therapist's discretion    >30 Impaired Mobility Further balance/mobility testing required (specific test is at PT's discretion)     BP: 163/105 mmHg  Patient reports he has HTN however has not taken his medicine this morning.    Functional Outcome Measure(s):  ?? Oswestry=62%    Observation/Posture: Standing: decreased lordosis, forward head   Pelvic Alignment/Landmarks: appear equal in standing, supine, and prone  Leg Length Assessment: appear equal in standing, supine, and prone  Sensation: Sensation to light touch grossly intact and equal at bilateral LE dermatomes  Palpation: Patient reports tenderness to palpation at lumbar paraspinals.      AROM Trunk        Flexion   2/3      Extension  1/3      Right Sidebend  1/3 pain       Left Sidebend   2/3      Right Rotaton  2/3      Left Rotation  2/3        Flexibility  Right  Left      HS (90-90)  -20 degrees -20 degrees      Piriformis  tight  tight      Quadriceps  tight  tight      Gastroc  tight  tight      Strength  Right  Left      Hip Flexion  4+/5  4+/5      Hip Abd  4+/5  4+/5      Hip Add  4+/5  4+/5  Knee Ext  4+/5  4+/5      Knee Flexion  4+/5  4+/5      Gluteus Medius  4-/5  4-/5      Ankle DF  4+/5  4+/5      Ankle Eversion  4/5  4/5      Great Toe Extension  4+/5  4+/5      Hip Ext  4-/5  4-/5       Gluteus Maximus  4-/5  4-/5   Upper Abdominals: 3/5  Lower Abdominals: 3/5     Special Tests  Right  Left      SLUMP  (-)  (-)      SLR  (-)  (-)      Ilial Shear  (-)  (-)      Compression  (-)  (-)      Distraction  (-)  (-)     Joint Play Assessment/Mobility: joint mobility deferred this date due to hx of spondylolisthesis    Today's Treatment:  Reviewed and discussed status, ther ex, POC/intervention options at length with patient. Home exercise program initiated, please see scanned handout for HEP ex's. Verbal and visual cueing provided for each exercise assigned to HEP. Detailed handouts with parameters provided. Patient demonstrated good/appropriate technique with HEP ex's.      ASSESSMENT:  Kristopher Day is a 46 y.o. male who presents with symptoms consistent with back pain with R sided sciatica. Pt exhibits decreased/impaired ROM/ flexibility/ strength/ posture and increased pain leading to altered function(See above-listed aggrav/functional limitations)  and should benefit from further treatment.    Complexities/Co-morbidities/Other Relevent Patient Conditions or Factors that may adversely affect the prognosis/rate of progress for this patient: Current Smoker     CMS Functional Reporting Codes:  ?? Current (based on Oswestry): G8981-GP, CL  ?? Goal: G8982-GP, CK    GOALS:   Short-Term Goals as needed to achieve Long-Term Functional Goals:  Independent with HEP with supervised modifications   Independent with self-management activities (e.g.-use of medications & passive modalities, proper posture or positioning)  Decrease pain  Improve Flexibility    Improve ROM at lumbar spine   Exhibit Strength > 4+/5 throughout B LE  Report increased postural awareness    Long-Term Functional Goals (to be achieved by discharge):     Patient will improve Oswestry score to < 42% to indicate improved function.     Patient will be able to lift a heavy object off of the floor without a significant increase in pain.       Patient will be able to sleep for >5 hours without a significant increase in pain or discomfort.      Patient will be able to stand more than an hour in order to participate in daily tasks/light weight duties around the home without a significant increase in pain.      Patient will be able to perform ADLs such as dressing and bathing without a significant increase in pain.      Patient will be able to sit over an hour without a significant increase in pain in order to travel long distances.           Plan of Care:    Frequency: 2x/week  Duration: 4 weeks    For a maximum total number of  8 visits over a Certification Period of: 03/04/2017 through 04/01/17 (re-assessment required on or before 04/01/17 per [facility] law)    Treatment to include (as indicated):   ??  Therapeutic exercise, Therapeutic activities, Neuromuscular re-education, Gait/balance training, Manual therapy, Aquatic therapy and Patient education    Rehab prognosis: Good  with active involvement in treatment.    Patient educated regarding findings/POC, and is in agreement with POC and above-listed goals. Patient agrees to be an active participant in his rehabilitation.    Date POC Established: 03/04/2017    Thank you for this referral.    Signed,    Noelle E Leighty    PT eval: 40 minutes (untimed code)   []   Low Complexity    [x]   Moderate Complexity   []   High Complexity  Therapeutic Exercise: 0 minutes  Total billable minutes at today's Initial Evaluation Appointment: 40 min      REFERRING PROVIDER APPROVAL:  I certify that the services identified in the evaluation are necessary.        Provider Signature:_________________________________  Date:_____________

## 2017-03-09 ENCOUNTER — Inpatient Hospital Stay: Admit: 2017-03-09 | Payer: MEDICAID | Primary: Family

## 2017-03-09 NOTE — Progress Notes (Addendum)
Physical Therapy Daily Treatment Note    Patient Information  Kristopher Day   10-Sep-1971  [id number]     Referring Provider: Para Marchice, James W, MD   Medical Diagnosis: back pain  Date of onset:  Ongoing for the past two and a half years   Date of Surgery:  Patient has follow up with his doctor in three weeks to schedule procedure.     DATE :03/09/2017  Visit 2 of 8 specified in original POC at time of Initial Evaluation.    Subjective:   Patient reports he is doing "okay". Patient states that he just got out of the shower which makes him feel better. Patient states its hardest to get out of bed.     Pain rating before treatment: 3/10    Pain rating after treatment: 3/10    Summary List:  Patient denies any new medical diagnoses or changes in medical conditions since last visit.  Patient denies any operations or procedures since last visit.  Patient denies any new adverse or allergic drug reactions since last visit.  Patient denies any changes to medication list (herbals, prescription, & OTC).     Objective:   Please refer to today's Treatment Flowsheet & or below listed activities for exercises / interventions performed today. These addressed any or all of the following: education / ROM / strength / flexibility / tissue healing / tissue mobility / joint mobility / edema control / pain control / postural awareness ( which are necessary to achieve pt's LTFG's).      Manual Therapy Interventions: Feathering technique with Rockblade tool on posterior fascial chain to improve soft tissue extensibility.   Neuromuscular Re-ed Interventions:---  Active Stretches: Gastroc, HS  Passive Stretches: ---    Date/  Exercises 03/09/17              Stretching  5 min               MTT 5 min               DKTC 3x10              Ball Rotations 3x10              Ball Bridges 3x10              Supine Abs  5s, 3x10              SLR 3x10              SL SLR 3x10              SL Clams  3x10              Heel Raises  3x10  Ice 10 min              Therapist Initials NEL                     Patient educated on continuing compliance with HEP.       Assessment:   Kristopher Day is a 46 y.o. male who presents with symptoms consistent with back pain with R sided sciatica. Pt exhibits decreased/impaired ROM/ flexibility/ strength/ posture and increased pain leading to altered function(See above-listed aggrav/functional limitations)  and should benefit from further treatment.  Patient tolerated today's session well. Patient does not report an increase in pain during the treatment. Patient does not demonstrate any signs of distress throughout the treatment.       Plan:   Plan to continue to see 2 x /week per the POC progressing as patient tolerates focusing on reducing deficits in order to improve overall function.      Re-certification due on or before 04/01/17 (re-assessment required on or before 04/01/17 per [place] law)      Earnest Conroy             Therapeutic Exercise (Timed Code): 30 minutes  Therapeutic Activity (Timed Code): 0 minutes  Manual Therapy (Timed Code): 0 minutes  Neuromuscular Re-Ed (Timed Code): 0 minutes  Total Billable Time: 30 minutes

## 2017-03-11 ENCOUNTER — Inpatient Hospital Stay: Admit: 2017-03-11 | Payer: MEDICAID | Primary: Family

## 2017-03-11 NOTE — Progress Notes (Addendum)
Physical Therapy Daily Treatment Note    Patient Information  Kristopher Day   03-26-1971  [id number]     Referring Provider: Para March, MD   Medical Diagnosis: back pain  Date of onset:  Ongoing for the past two and a half years   Date of Surgery:  Patient has follow up with his doctor in three weeks to schedule procedure.     DATE :03/11/2017  Visit 3 of 8 specified in original POC at time of Initial Evaluation.    Subjective:   Patient reports stretches and exercise are helping.     Pain rating before treatment: 3/10    Pain rating after treatment: 3/10    Summary List:  Patient denies any new medical diagnoses or changes in medical conditions since last visit.  Patient denies any operations or procedures since last visit.  Patient denies any new adverse or allergic drug reactions since last visit.  Patient denies any changes to medication list (herbals, prescription, & OTC).     Objective:   Please refer to today's Treatment Flowsheet & or below listed activities for exercises / interventions performed today. These addressed any or all of the following: education / ROM / strength / flexibility / tissue healing / tissue mobility / joint mobility / edema control / pain control / postural awareness ( which are necessary to achieve pt's LTFG's).      Manual Therapy Interventions: Feathering technique with Rockblade tool on posterior fascial chain to improve soft tissue extensibility.   Neuromuscular Re-ed Interventions:---  Active Stretches: Gastroc, HS  Passive Stretches: ---    Date/  Exercises 03/09/17 3/15             Stretching  5 min  5 min             MTT 5 min  5 min             DKTC 3x10 3x10             Ball Rotations 3x10 3x10             Ball Bridges 3x10 3x10             Supine Abs  5s, 3x10 5s 3x10             SLR 3x10 3x10             SL SLR 3x10 3x10 B             SL Clams  3x10 3x10 B             Heel Raises  3x10 3x10                                                                                                                                                     Ice  10 min 10 min             Therapist Initials NEL  TV                   Patient educated on continuing compliance with HEP.       Assessment:   Kristopher Day is a 46 y.o. male who presents with symptoms consistent with back pain with R sided sciatica. Pt exhibits decreased/impaired ROM/ flexibility/ strength/ posture and increased pain leading to altered function(See above-listed aggrav/functional limitations)  and should benefit from further treatment. Tolerated today's treatment well. Able to complete all exercises with good form. Complaint with home exercises.       Plan:   Plan to continue to see 2 x /week per the POC progressing as patient tolerates focusing on reducing deficits in order to improve overall function.      Re-certification due on or before 04/01/17 (re-assessment required on or before 04/01/17 per [place] law)      Kristopher Day, PTA             Therapeutic Exercise (Timed Code): 30 minutes  Therapeutic Activity (Timed Code): 0 minutes  Manual Therapy (Timed Code): 0 minutes  Neuromuscular Re-Ed (Timed Code): 0 minutes  Total Billable Time: 30 minutes

## 2017-03-16 ENCOUNTER — Inpatient Hospital Stay: Admit: 2017-03-16 | Payer: MEDICAID | Primary: Family

## 2017-03-16 NOTE — Progress Notes (Signed)
Physical Therapy Daily Treatment Note    Patient Information  Kristopher Day   1971/03/24  [id number]     Referring Provider: Para March, MD   Medical Diagnosis: back pain  Date of onset:  Ongoing for the past two and a half years   Date of Surgery:  Patient has follow up with his doctor in three weeks to schedule procedure.     DATE :03/16/2017  Visit 3 of 8 specified in original POC at time of Initial Evaluation.    Subjective:   Patient reports the pain is the "same" as last time. Patient states he hasn't been doing his HEP as prescribed due to being busy over the weekend.     Pain rating before treatment: 3/10    Pain rating after treatment: 3/10    Summary List:  Patient denies any new medical diagnoses or changes in medical conditions since last visit.  Patient denies any operations or procedures since last visit.  Patient denies any new adverse or allergic drug reactions since last visit.  Patient denies any changes to medication list (herbals, prescription, & OTC).     Objective:   Please refer to today's Treatment Flowsheet & or below listed activities for exercises / interventions performed today. These addressed any or all of the following: education / ROM / strength / flexibility / tissue healing / tissue mobility / joint mobility / edema control / pain control / postural awareness ( which are necessary to achieve pt's LTFG's).      Manual Therapy Interventions: ---  Neuromuscular Re-ed Interventions:---  Active Stretches: Gastroc, HS  Passive Stretches: ---    Date/  Exercises 03/09/17 3/15 3/20            Stretching  5 min  5 min 5 min            MTT 5 min  5 min             DKTC 3x10 3x10 5 min            Ball Rotations 3x10 3x10 5 min            Ball Bridges 3x10 3x10 5 min            Supine Abs  5s, 3x10 5s 3x10 5s, 3x10            SLR 3x10 3x10 3# 3x10            SL SLR 3x10 3x10 B 3# 3x10            SL Clams  3x10 3x10 B 3x10 B            Heel Raises  3x10 3x10 3x10             Prone SLR   3x10            Standing Hip Ext   3x10  NUStep    L 10, 7 min             Ice 10 min 10 min 10 min             Therapist Initials NEL  TV NEL                   Patient educated on continuing compliance with HEP.       Assessment:   [name] is a 46 y.o. male who presents with symptoms consistent with back pain with R sided sciatica. Pt exhibits decreased/impaired ROM/ flexibility/ strength/ posture and increased pain leading to altered function(See above-listed aggrav/functional limitations)  and should benefit from further treatment. Tolerated today's treatment well. Patient able to tolerate added weight and resistance without an increase in pain. Patient is progressing well with exercises.       Plan:   Plan to continue to see 2 x /week per the POC progressing as patient tolerates focusing on reducing deficits in order to improve overall function.      Re-certification due on or before 04/01/17 (re-assessment required on or before 04/01/17 per [place] law)      Earnest ConroyNoelle E Leighty             Therapeutic Exercise (Timed Code): 20 minutes  Therapeutic Activity (Timed Code): 0 minutes  Manual Therapy (Timed Code): 0 minutes  Neuromuscular Re-Ed (Timed Code): 0 minutes  Total Billable Time: 20 minutes

## 2017-03-18 ENCOUNTER — Inpatient Hospital Stay: Admit: 2017-03-18 | Payer: MEDICAID | Primary: Family

## 2017-03-18 NOTE — Progress Notes (Signed)
Physical Therapy Daily Treatment Note    Patient Information  Kristopher Day   Oct 01, 1971  [id number]     Referring Provider: Para Marchice, James W, MD   Medical Diagnosis: back pain  Date of onset:  Ongoing for the past two and a half years   Date of Surgery:  Patient has follow up with his doctor in three weeks to schedule procedure.     DATE :03/18/2017  Visit 4 of 8 specified in original POC at time of Initial Evaluation.    Subjective:   Patient states he feels good after therapy. Reports minimal changes with pain at night and first thing in the morning.     Pain rating before treatment: 3/10    Pain rating after treatment: 3/10    Summary List:  Patient denies any new medical diagnoses or changes in medical conditions since last visit.  Patient denies any operations or procedures since last visit.  Patient denies any new adverse or allergic drug reactions since last visit.  Patient denies any changes to medication list (herbals, prescription, & OTC).     Objective:   Please refer to today's Treatment Flowsheet & or below listed activities for exercises / interventions performed today. These addressed any or all of the following: education / ROM / strength / flexibility / tissue healing / tissue mobility / joint mobility / edema control / pain control / postural awareness ( which are necessary to achieve pt's LTFG's).      Manual Therapy Interventions: ---  Neuromuscular Re-ed Interventions:---  Active Stretches: Gastroc, HS  Passive Stretches: psoas to improve LE flexibilirty    Date/  Exercises 03/09/17 3/15 3/20 3/22           Stretching  5 min  5 min 5 min 5 min           MTT 5 min  5 min             DKTC 3x10 3x10 5 min 3x10           Ball Rotations 3x10 3x10 5 min 3x10           Ball Bridges 3x10 3x10 5 min 3x10           Supine Abs  5s, 3x10 5s 3x10 5s, 3x10 5s 3x10           SLR 3x10 3x10 3# 3x10 3#3x10           SL SLR 3x10 3x10 B 3# 3x10 3#3x10           SL Clams  3x10 3x10 B 3x10 B 3x10 B            Heel Raises  3x10 3x10 3x10 3X10           Prone SLR   3x10 3X10           Standing Hip Ext   3x10            Standing abs    5s 3x10  NUStep    L 10, 7 min  Bike L5 10 min           Ice 10 min 10 min 10 min             Therapist Initials NEL  TV NEL  TV                 Patient educated on continuing compliance with HEP.       Assessment:   Kristopher Day is a 46 y.o. male who presents with symptoms consistent with back pain with R sided sciatica. Pt exhibits decreased/impaired ROM/ flexibility/ strength/ posture and increased pain leading to altered function(See above-listed aggrav/functional limitations)  and should benefit from further treatment. Tolerated today's treatment well. Good performance with all exercises without report of increased pain.       Plan:   Plan to continue to see 2 x /week per the POC progressing as patient tolerates focusing on reducing deficits in order to improve overall function.      Re-certification due on or before 04/01/17 (re-assessment required on or before 04/01/17 per [place] law)      Yehuda Budd, PTA             Therapeutic Exercise (Timed Code): 25 minutes  Therapeutic Activity (Timed Code): 0 minutes  Manual Therapy (Timed Code): 0 minutes  Neuromuscular Re-Ed (Timed Code): 0 minutes  Total Billable Time: 25 minutes

## 2017-03-23 ENCOUNTER — Encounter: Payer: MEDICAID | Primary: Family

## 2017-03-25 ENCOUNTER — Encounter: Payer: MEDICAID | Primary: Family

## 2017-03-30 ENCOUNTER — Encounter: Payer: MEDICAID | Primary: Family

## 2017-04-01 ENCOUNTER — Inpatient Hospital Stay: Admit: 2017-04-01 | Payer: MEDICAID | Primary: Family

## 2017-04-01 DIAGNOSIS — Z5189 Encounter for other specified aftercare: Secondary | ICD-10-CM

## 2017-04-01 NOTE — Progress Notes (Signed)
Physical Therapy Daily Treatment Note/Re-Evaluation     Patient Information  Kristopher Day   11-Sep-1971  [id number]     Referring Provider: Para March, MD   Medical Diagnosis: back pain  Date of onset:  Ongoing for the past two and a half years   Date of Surgery:  Patient has follow up with his doctor in three weeks to schedule procedure.     DATE :04/02/2017  Visit 5 of 8 specified in original POC at time of Initial Evaluation.    Subjective:   Patient states he had a set back last week due to hanging dry wall. Patient finds some relief with ice packs. Overall, patient states that he always feels better after he leaves therapy, however he still hurts waking up and sleeping.     Pain rating before treatment: 6/10    Pain rating after treatment: 4/10    Summary List:  Patient denies any new medical diagnoses or changes in medical conditions since last visit.  Patient denies any operations or procedures since last visit.  Patient denies any new adverse or allergic drug reactions since last visit.  Patient denies any changes to medication list (herbals, prescription, & OTC).     Objective:   Please refer to today's Treatment Flowsheet & or below listed activities for exercises / interventions performed today. These addressed any or all of the following: education / ROM / strength / flexibility / tissue healing / tissue mobility / joint mobility / edema control / pain control / postural awareness ( which are necessary to achieve pt's LTFG's).      Manual Therapy Interventions: ---  Neuromuscular Re-ed Interventions:---  Active Stretches: Gastroc, HS  Passive Stretches: psoas to improve LE flexibilirty    Date/  Exercises 03/09/17 3/15 3/20 3/22           Stretching  5 min  5 min 5 min 5 min           MTT 5 min  5 min             DKTC 3x10 3x10 5 min 3x10           Ball Rotations 3x10 3x10 5 min 3x10           Ball Bridges 3x10 3x10 5 min 3x10           Supine Abs  5s, 3x10 5s 3x10 5s, 3x10 5s 3x10            SLR 3x10 3x10 3# 3x10 3#3x10           SL SLR 3x10 3x10 B 3# 3x10 3#3x10           SL Clams  3x10 3x10 B 3x10 B 3x10 B           Heel Raises  3x10 3x10 3x10 3X10           Prone SLR   3x10 3X10           Standing Hip Ext   3x10            Standing abs    5s 3x10  NUStep    L 10, 7 min  Bike L5 10 min           Ice 10 min 10 min 10 min             Therapist Initials NEL  TV NEL  TV             Functional Outcome Measure(s):  ?? Oswestry=44%    AROM Trunk ????      Flexion   2/3      Extension  2/3      Right Sidebend  2/3      Left Sidebend   2/3      Right Rotaton  2/3      Left Rotation  2/3   ??  ??   Flexibility  Right  Left      HS (90-90)  -20 degrees -5 degrees      Piriformis  tight  tight      Quadriceps  tight  tight      Gastroc  tight  tight   ??   Strength  Right  Left      Hip Flexion  4+/5  4+/5      Hip Abd  4+/5  4+/5      Hip Add  4+/5  4+/5      Knee Ext  4+/5  4+/5      Knee Flexion  4+/5  4+/5      Gluteus Medius  4/5  4/5      Ankle DF  4+/5  4+/5      Ankle Eversion  4/5  4/5      Great Toe Extension  4+/5  4+/5      Hip Ext  4/5  4/5      Gluteus Maximus  4+/5  4+/5   Upper Abdominals: 3+/5  Lower Abdominals: 3+/5  ??  Patient educated on continuing compliance with HEP.       Assessment:   Kristopher Day is a 46 y.o. male who presents with symptoms consistent with back pain with R sided sciatica. Pt exhibits decreased/impaired ROM/ flexibility/ strength/ posture and increased pain leading to altered function and should benefit from further treatment. Patient continues to have strength, flexibility, and endurance deficits.  Patient has had some improvements with therapy allowing for improved patient function.  Patient may benefit from continued therapy however will await for report from physician to determine when patient will have surgery.      CMS Functional Reporting Codes:  ?? Current (based on Oswestry): G8981-GP, CK   ?? Goal: G8982-GP, CK  ??    Goals:  Continue with goals per initial evaluation.      Plan:   Plan to continue to see 2 x /week for an additional 2 week progressing as patient tolerates focusing on reducing deficits in order to improve overall function given results of physician visit.      Re-certification due on or before 04/14/17 (re-assessment required on or before 04/14/17 per [place] law)      Sharma Covert, PT     Start Time: [phone]   Stop Time: 0940   Time Calculation: 25 mins  Therapeutic Exercise (Timed Code): 20 minutes  Therapeutic Activity (Timed Code): 0 minutes  Manual Therapy (Timed Code): 0 minutes  Neuromuscular Re-Ed (Timed Code): 0 minutes  Total Billable Time: 20 minutes    REFERRING PROVIDER APPROVAL:  I certify that the services identified in the evaluation/re-evaluation/progress note are necessary.  Provider Signature:_________________________________  Date:_____________

## 2017-04-01 NOTE — Progress Notes (Signed)
Non-Compliant Physical Therapy Discharge Summary    NAME: Kristopher Day  DOB: October 08, 1971  REFERRING PROVIDER: Para March, MD  DIAGNOSIS: back pain    DATE: 06/21/2017    Patient attended 5 PT visits then did not attend any additional PT visits. Patient's current status is unknown. Patient has been discharged due to non-compliance.    Signed,   Sharma Covert, PT

## 2019-05-30 ENCOUNTER — Emergency Department: Payer: Medicaid - Out of State

## 2019-05-30 ENCOUNTER — Emergency Department
Admission: EM | Admit: 2019-05-30 | Discharge: 2019-05-30 | Disposition: A | Discharge status: Home / Self Care | Payer: Medicaid - Out of State | Attending: Emergency Medicine | Admitting: Emergency Medicine

## 2019-05-30 ENCOUNTER — Other Ambulatory Visit: Payer: Self-pay

## 2019-05-30 DIAGNOSIS — Y9389 Activity, other specified: Secondary | ICD-10-CM | POA: Diagnosis not present

## 2019-05-30 DIAGNOSIS — Y990 Civilian activity done for income or pay: Secondary | ICD-10-CM | POA: Insufficient documentation

## 2019-05-30 DIAGNOSIS — R569 Unspecified convulsions: Secondary | ICD-10-CM | POA: Insufficient documentation

## 2019-05-30 DIAGNOSIS — Z9114 Patient's other noncompliance with medication regimen: Secondary | ICD-10-CM | POA: Insufficient documentation

## 2019-05-30 DIAGNOSIS — W19XXXA Unspecified fall, initial encounter: Secondary | ICD-10-CM | POA: Diagnosis not present

## 2019-05-30 DIAGNOSIS — Z79899 Other long term (current) drug therapy: Secondary | ICD-10-CM | POA: Insufficient documentation

## 2019-05-30 DIAGNOSIS — Y9289 Other specified places as the place of occurrence of the external cause: Secondary | ICD-10-CM | POA: Insufficient documentation

## 2019-05-30 LAB — BASIC METABOLIC PANEL WITH GFR
Anion gap: 10 (ref 5–15)
BUN: 8 mg/dL (ref 6–20)
CO2: 24 mmol/L (ref 22–32)
Calcium: 9.6 mg/dL (ref 8.9–10.3)
Chloride: 101 mmol/L (ref 98–111)
Creatinine, Ser: 0.92 mg/dL (ref 0.61–1.24)
GFR calc Af Amer: >60 mL/min (ref >60)
GFR calc non Af Amer: >60 mL/min (ref >60)
Glucose, Bld: 125 mg/dL — ABNORMAL HIGH (ref 70–99)
Potassium: 3.8 mmol/L (ref 3.5–5.1)
Sodium: 135 mmol/L (ref 135–145)

## 2019-05-30 LAB — CBC
HCT: 39.9 % (ref 39.0–52.0)
Hemoglobin: 14 g/dL (ref 13.0–17.0)
MCH: 35 pg — ABNORMAL HIGH (ref 26.0–34.0)
MCHC: 35.1 g/dL (ref 30.0–36.0)
MCV: 99.8 fL (ref 80.0–100.0)
Platelets: 196 10*3/uL (ref 150–400)
RBC: 4 MIL/uL — ABNORMAL LOW (ref 4.22–5.81)
RDW: 12.5 % (ref 11.5–15.5)
WBC: 7.3 10*3/uL (ref 4.0–10.5)
nRBC: 0 % (ref 0.0–0.2)

## 2019-05-30 MED ORDER — LEVETIRACETAM 500 MG PO TABS
1000.0000 mg | ORAL_TABLET | Freq: Once | ORAL | Status: AC
  Administered 2019-05-30: 1000 mg via ORAL
  Filled 2019-05-30: qty 2

## 2019-05-30 MED ORDER — LEVETIRACETAM 500 MG PO TABS: 500.0000 mg | ORAL_TABLET | Freq: Two times a day (BID) | ORAL | 0 refills | Status: AC

## 2019-05-30 MED ORDER — LORAZEPAM 2 MG/ML IJ SOLN
1.0000 mg | Freq: Once | INTRAMUSCULAR | Status: AC
  Administered 2019-05-30: 13:00:00 1 mg via INTRAVENOUS
  Filled 2019-05-30: qty 1

## 2019-05-30 MED ORDER — SODIUM CHLORIDE 0.9 % IV BOLUS
1000.0000 mL | Freq: Once | INTRAVENOUS | Status: AC
  Administered 2019-05-30: 1000 mL via INTRAVENOUS

## 2019-05-30 NOTE — ED Provider Notes (Signed)
[facility] Emergency Department Provider Note  ____________________________________________   First MD Initiated Contact with Patient 05/30/19 1158     (approximate)  I have reviewed the triage vital signs and the nursing notes.   HISTORY  Chief Complaint Fall and Seizures    HPI Joel Hardy is a 48 y.o. male here with fall and seizure activity.  The patient states that over the last several months, he has been feeling generally well and has stopped taking his Keppra.  He is also stopped taking his blood pressure medications.  He admits that he has been working significantly more than usual.  He works outside and does [employer].  He reportedly was at a worksite today, when he states he began to feel "out of it."  This has happened prior to his other seizures.  He had also just stood up.  He states he believes that he bent down, then lost consciousness.  Per report, the patient was witnessed to have shaking like activity, and quickly stopped.  He slowly then returned to his baseline.  He is unsure how long he had lost consciousness.  He remembers waking up with EMS there.  He reports that he did not bite his tongue.  No loss of bowel or bladder function.  Symptoms are similar to his prior seizures.  Of note, he also admits to heavy alcohol use and states that over the last several days, has been trying to cut down on his drinking.  Denies any active tremors.  No headache.  No focal numbness or weakness.  No other medical complaints.        No past medical history on file.   PMHx: Seizure disorder, HTN, alcohol dependence  PSHx: No h/o intracranial surgeries, no relevant PSHx  FHx: Non-contributory, reviewed  SHx: Smokes, drinks alcohol daily (6 pack/day)  There are no active problems to display for this patient.     Prior to Admission medications   Medication Sig Start Date End Date Taking? Authorizing Provider  atorvastatin (LIPITOR) 20 MG tablet  Take 20 mg by mouth daily.   Yes [provider]  Diclofenac Sodium CR 100 MG 24 hr tablet Take 100 mg by mouth daily.   Yes [provider]  lisinopril (ZESTRIL) 20 MG tablet Take 20 mg by mouth daily.   Yes [provider]  levETIRAcetam (KEPPRA) 500 MG tablet Take 1 tablet (500 mg total) by mouth 2 (two) times daily for 14 days. 05/30/19 06/13/19  Shaune Pollack, MD    Allergies Patient has no known allergies.  No family history on file.  Social History Social History   Tobacco Use  . Smoking status: Not on file  Substance Use Topics  . Alcohol use: Not on file  . Drug use: Not on file    Review of Systems Constitutional: No fever/chills Eyes: No visual changes. ENT: No sore throat. Cardiovascular: Denies chest pain. Respiratory: Denies shortness of breath. Gastrointestinal: No abdominal pain.  No nausea, no vomiting.  No diarrhea.  No constipation. Genitourinary: Negative for dysuria. Musculoskeletal: Negative for neck pain.  Negative for back pain. Integumentary: Negative for rash. Neurological: Negative for headaches, focal weakness or numbness. ____________________________________________   PHYSICAL EXAM:  VITAL SIGNS: ED Triage Vitals  Enc Vitals Group     BP 05/30/19 1135 (!) 167/115     Pulse Rate 05/30/19 1330 85     Resp 05/30/19 1135 17     Temp 05/30/19 1135 98.1 F (36.7 C)  Temp Source 05/30/19 1135 Oral     SpO2 05/30/19 1135 97 %     Weight 05/30/19 1134 180 lb (81.6 kg)     Height 05/30/19 1134 6' (1.829 m)     Head Circumference --      Peak Flow --      Pain Score 05/30/19 1133 0     Pain Loc --      Pain Edu? --      Excl. in GC? --    Constitutional: Alert and oriented. Well appearing and in no acute distress. Eyes: Conjunctivae are normal.  Head: Atraumatic. Nose: No congestion/rhinnorhea. Mouth/Throat: Mucous membranes are moist. No tongue lacerations. Neck: No stridor.  No meningeal signs.  No midline  or paraspinal TTP. Cardiovascular: Normal rate, regular rhythm. Good peripheral circulation. Grossly normal heart sounds. Respiratory: Normal respiratory effort.  No retractions. No audible wheezing. Gastrointestinal: Soft and nontender. No distention.  Musculoskeletal: No lower extremity tenderness nor edema. No gross deformities of extremities. Neurologic:  Normal speech and language. No gross focal neurologic deficits are appreciated.  Skin:  Skin is warm, dry and intact. No rash noted.  Neurological Exam:  Mental Status: Alert and oriented to person, place, and time. Attention and concentration normal. Speech clear. Recent memory is intact. Cranial Nerves: Visual fields grossly intact. EOMI and PERRLA. No nystagmus noted. Facial sensation intact at forehead, maxillary cheek, and chin/mandible bilaterally. No facial asymmetry or weakness. Hearing grossly normal. Uvula is midline, and palate elevates symmetrically. Normal SCM and trapezius strength. Tongue midline without fasciculations. Motor: Muscle strength 5/5 in proximal and distal UE and LE bilaterally. No pronator drift. Muscle tone normal. Reflexes: 2+ and symmetrical in all four extremities.  Sensation: Intact to light touch in upper and lower extremities distally bilaterally.  Gait: Normal without ataxia. Coordination: Normal FTN bilaterally.  ____________________________________________   LABS (all labs ordered are listed, but only abnormal results are displayed)  Labs Reviewed  CBC - Abnormal; Notable for the following components:      Result Value   RBC 4.00 (*)    MCH 35.0 (*)    All other components within normal limits  BASIC METABOLIC PANEL - Abnormal; Notable for the following components:   Glucose, Bld 125 (*)    All other components within normal limits   ____________________________________________  EKG: On arrival, sinus tachycardia.  Ventricular rate 121.  PR normal.  QRS 109, QTc 459.  No apparent ischemic  changes.  Normal intervals.  No arrhythmia.   ____________________________________________  RADIOLOGY All imaging, including plain films, CT scans, and ultrasounds, independently reviewed by me, and interpretations confirmed via formal radiology reads.  ED MD interpretation:  CT Head: No acute abnormality. No fx. No hematoma.  Official radiology report(s): Ct Head Wo Contrast  Result Date: 05/30/2019 CLINICAL DATA:  Per EMS, the patient's coworkers witnessed the patient stumble, attempt to catch himself by grabbing a ladder, then fall to the ground. Patient has a history of seizures. EXAM: CT HEAD WITHOUT CONTRAST TECHNIQUE: Contiguous axial images were obtained from the base of the skull through the vertex without intravenous contrast. COMPARISON:  None. FINDINGS: Brain: No evidence of acute infarction, hemorrhage, hydrocephalus, extra-axial collection or mass lesion/mass effect. Mild cerebral atrophy. Vascular: No hyperdense vessel or unexpected calcification. Skull: No osseous abnormality. Sinuses/Orbits: Visualized paranasal sinuses are clear. Visualized mastoid sinuses are clear. Visualized orbits demonstrate no focal abnormality. Other: None IMPRESSION: No acute intracranial pathology. Electronically Signed   By: Elige Ko   On:  05/30/2019 12:54    ____________________________________________   PROCEDURES   Procedure(s) performed (including Critical Care):  Procedures   ____________________________________________   INITIAL IMPRESSION / MDM / ASSESSMENT AND PLAN / ED COURSE  As part of my medical decision making, I reviewed the following data within the electronic MEDICAL RECORD NUMBER Notes from prior ED visits and [place] Controlled Substance Database      *Gerda Dissodd Wawrzyniak was evaluated in Emergency Department on 05/30/2019 for the symptoms described in the history of present illness. He was evaluated in the context of the global COVID-19 pandemic, which necessitated consideration that  the patient might be at risk for infection with the SARS-CoV-2 virus that causes COVID-19. Institutional protocols and algorithms that pertain to the evaluation of patients at risk for COVID-19 are in a state of rapid change based on information released by regulatory bodies including the CDC and federal and state organizations. These policies and algorithms were followed during the patient's care in the ED.  Some ED evaluations and interventions may be delayed as a result of limited staffing during the pandemic.*      Medical Decision Making: 48 year old male with history of seizure disorder and alcohol dependence here with questionable syncopal versus seizure-like activity.  Given his nonadherence with Keppra, postictal confusion, as well as decreased alcohol use, I suspect patient had a brief, resolved generalized seizure precipitated by med nonadherence in addition to decreased alcohol use.  He has no focal neurological deficits to suspect CVA.  He is at his mental baseline now.  No arrhythmia or ectopy on monitor and I do not suspect he had a cardiac event.  Vital signs are stable.  Lab work reassuring with normal electrolytes.  Discussed findings with patient, will restart his Keppra at his previous dose, discharged with outpatient neurology and PCP follow-up.  No driving or operating heavy machinery. ____________________________________________  FINAL CLINICAL IMPRESSION(S) / ED DIAGNOSES  Final diagnoses:  Seizure (HCC)     MEDICATIONS GIVEN DURING THIS VISIT:  Medications  LORazepam (ATIVAN) injection 1 mg (1 mg Intravenous Given 05/30/19 1253)  sodium chloride 0.9 % bolus 1,000 mL (0 mLs Intravenous Stopped 05/30/19 1342)  levETIRAcetam (KEPPRA) tablet 1,000 mg (1,000 mg Oral Given 05/30/19 1253)     ED Discharge Orders         Ordered    levETIRAcetam (KEPPRA) 500 MG tablet  2 times daily     05/30/19 1440           Note:  This document was prepared using Dragon voice  recognition software and may include unintentional dictation errors.   Shaune PollackIsaacs, Alhaji Mcneal, MD 05/30/19 [phone]

## 2019-05-30 NOTE — Discharge Instructions (Addendum)
Start taking the Keppra as prescribed.  Have given you 2 weeks.  Call your neurologist to set up an appointment.  He can also call 1 of the clinics above to try to get an appointment.  You cannot drive by [facility], or operate heavy machinery, until you are seizure-free for at least 6 months.  You can discuss with your doctor.

## 2019-05-30 NOTE — ED Triage Notes (Signed)
Per EMS, the patient's coworkers witnessed the patient stumble, attempt to catch himself by grabbing a ladder, then fall to the ground. Patient has a history of seizures.

## 2020-08-27 IMAGING — CT CT HEAD WITHOUT CONTRAST
3 series · 16 of 47 positions shown, 19 images · non-contrast
Comparison: None.

CLINICAL DATA: Per EMS, the patient's coworkers witnessed the
patient stumble, attempt to catch himself by grabbing a ladder, then
fall to the ground. Patient has a history of seizures.

EXAM:
CT HEAD WITHOUT CONTRAST
TECHNIQUE: Contiguous axial images were obtained from the base of the skull
through the vertex without intravenous contrast.

[Series 2: head wo · axial · 0.42mm/px · z∈[-108,+22]mm · 10 of 32 slices shown, 13 images]
[im 3/32  brain]
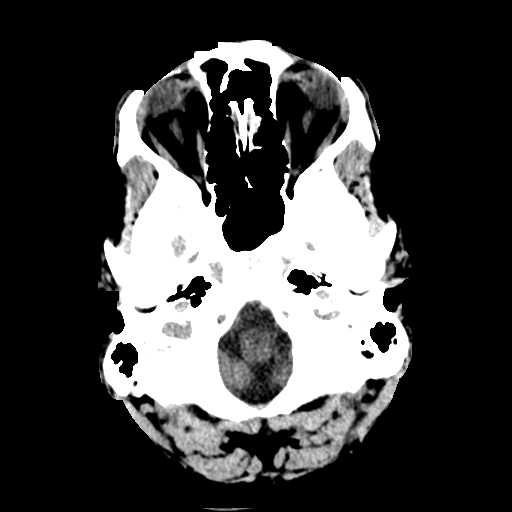
[im 3/32  bone]
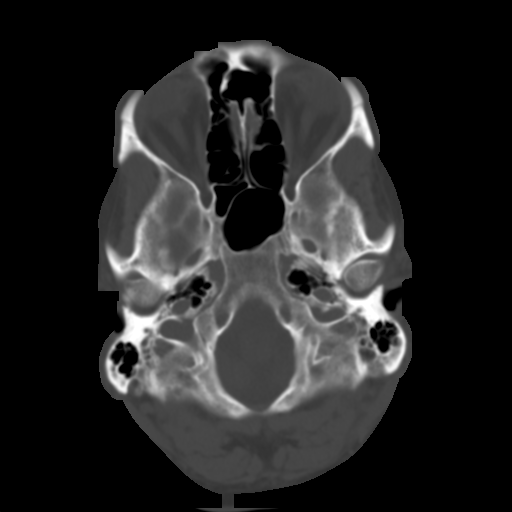
[im 6/32  brain]
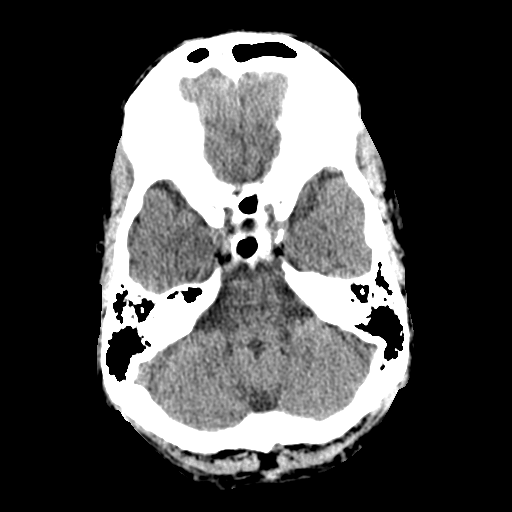
[im 9/32  brain]
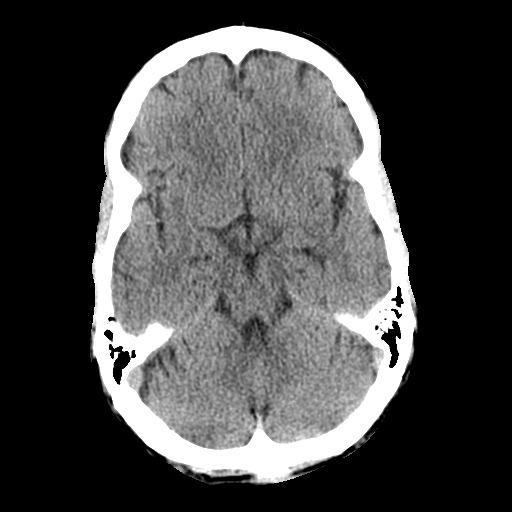
[im 11/32  brain]
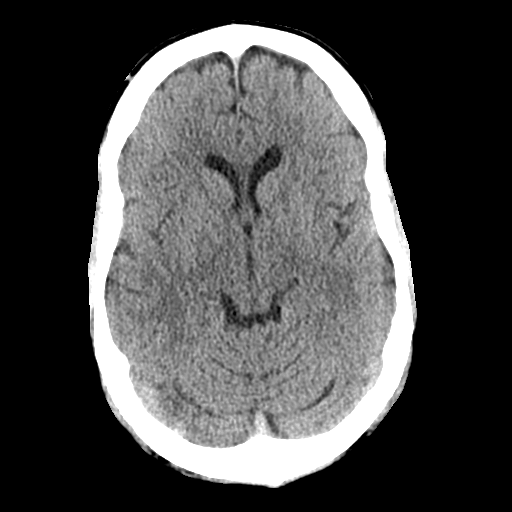
[im 14/32  brain]
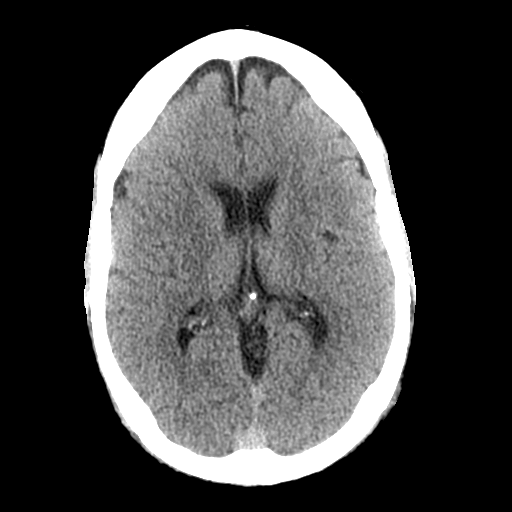
[im 14/32  bone]
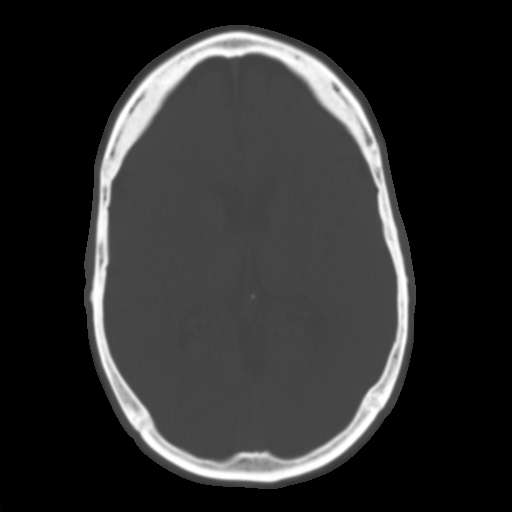
[im 18/32  brain]
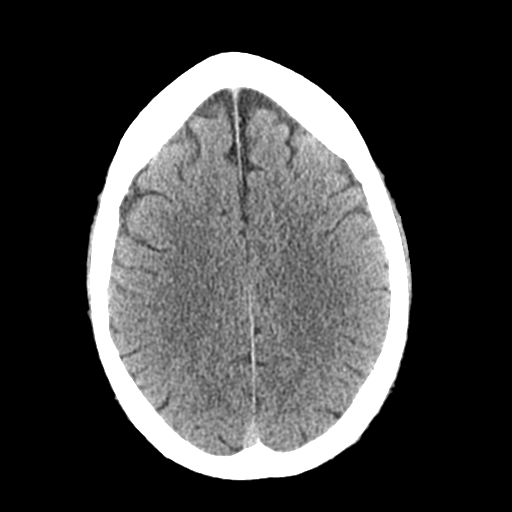
[im 21/32  brain]
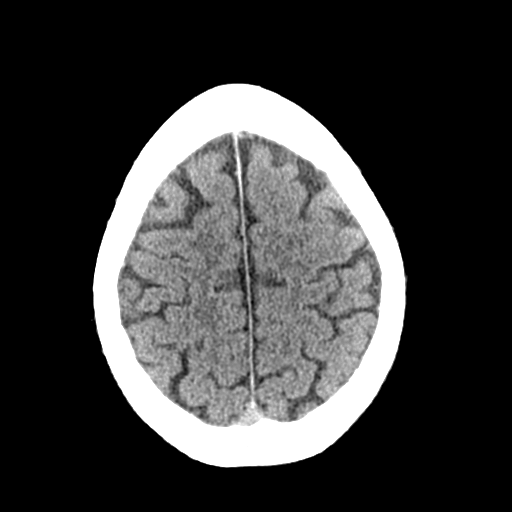
[im 24/32  brain]
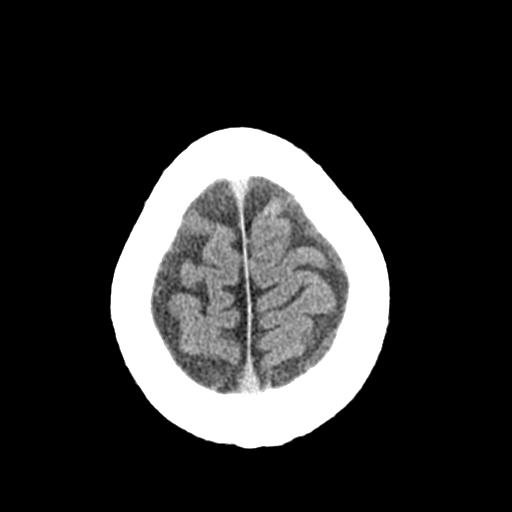
[im 26/32  brain]
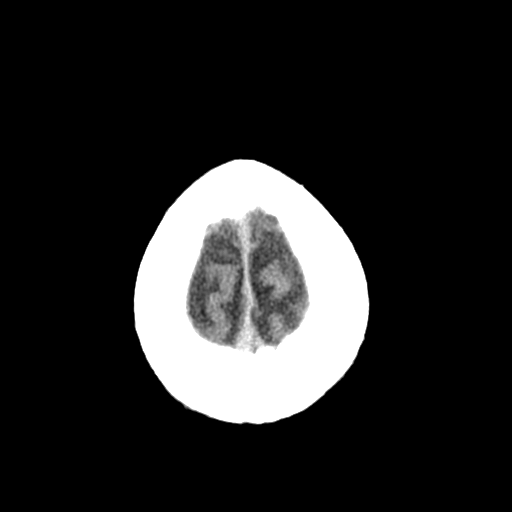
[im 26/32  bone]
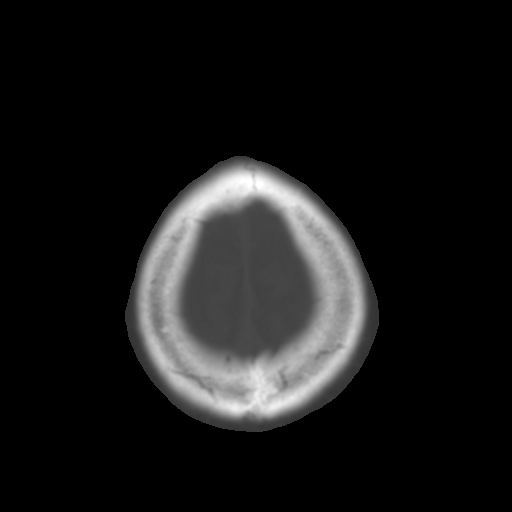
[im 29/32  brain]
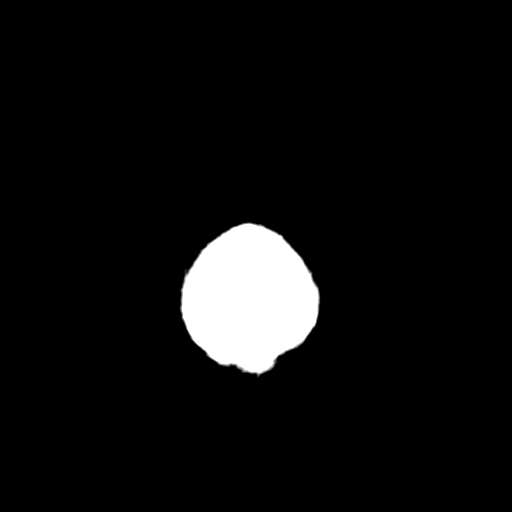

[Series 4: coronal soft tissue · coronal · 0.32mm/px · 3 of 70 slices shown]
[im 24/70  brain]
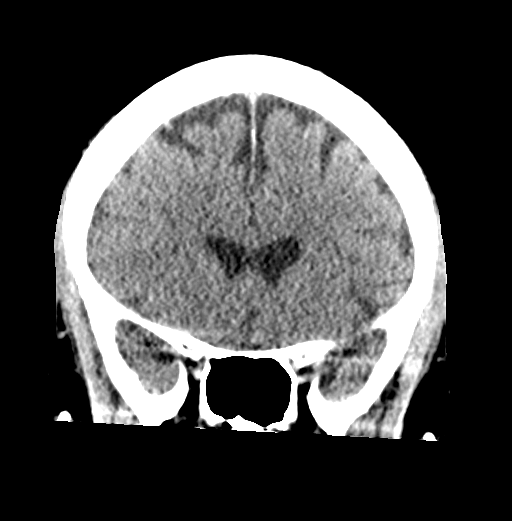
[im 31/70  brain]
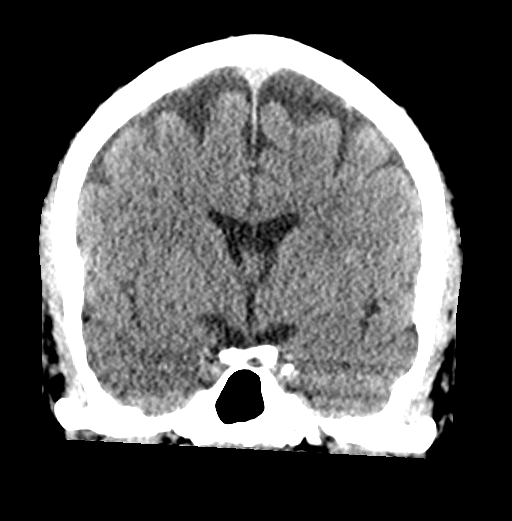
[im 39/70  brain]
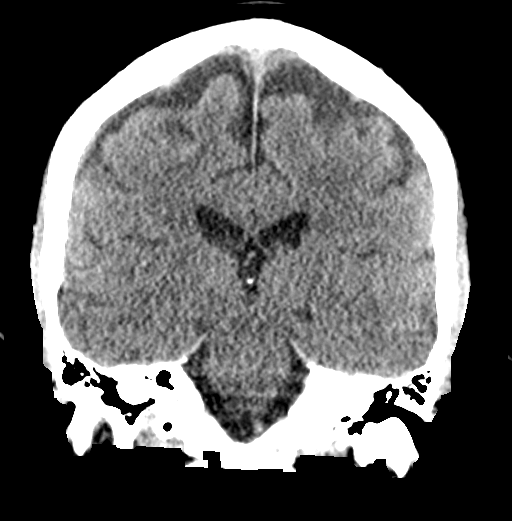

[Series 5: sagittal soft tissue · sagittal · 0.33mm/px · 3 of 54 slices shown]
[im 18/54  brain]
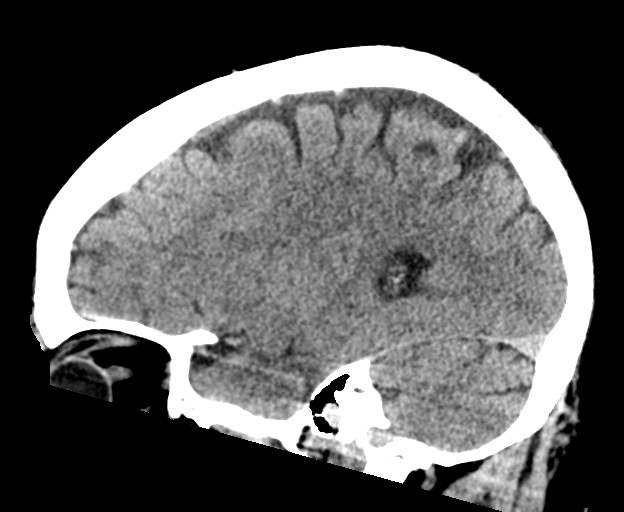
[im 27/54  brain]
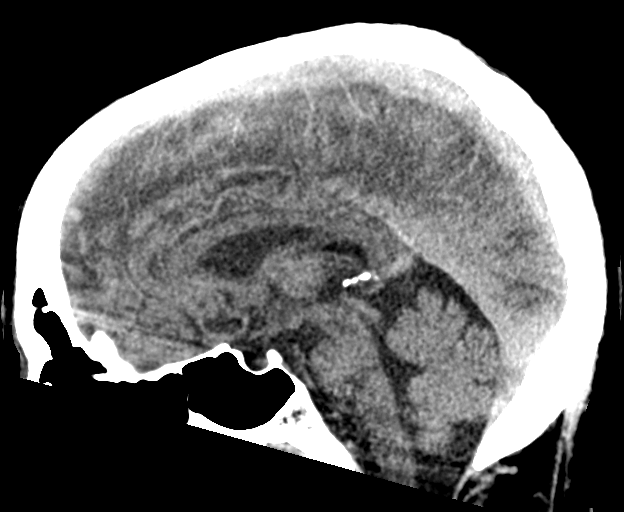
[im 36/54  brain]
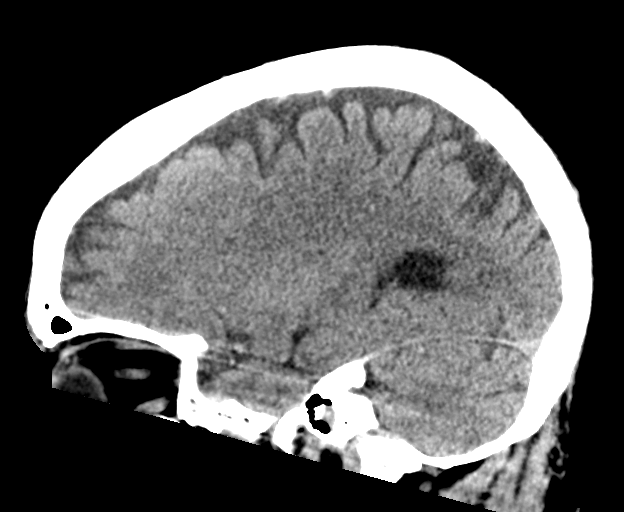

[16 of 47 positions shown; findings below may reference images not displayed]

FINDINGS: Brain: No evidence of acute infarction, hemorrhage, hydrocephalus,
extra-axial collection or mass lesion/mass effect. Mild cerebral
atrophy.

Vascular: No hyperdense vessel or unexpected calcification.

Skull: No osseous abnormality.

Sinuses/Orbits: Visualized paranasal sinuses are clear. Visualized
mastoid sinuses are clear. Visualized orbits demonstrate no focal
abnormality.

Other: None
IMPRESSION: No acute intracranial pathology.
